# Patient Record
Sex: Female | Born: 1950
Health system: Southern US, Community
[De-identification: ages and names within clinical notes are randomized; demographics above are authoritative.]

## PROBLEM LIST (undated history)

## (undated) DIAGNOSIS — I341 Nonrheumatic mitral (valve) prolapse: Secondary | ICD-10-CM

## (undated) DIAGNOSIS — F329 Major depressive disorder, single episode, unspecified: Secondary | ICD-10-CM

## (undated) DIAGNOSIS — C801 Malignant (primary) neoplasm, unspecified: Secondary | ICD-10-CM

## (undated) DIAGNOSIS — B029 Zoster without complications: Secondary | ICD-10-CM

## (undated) DIAGNOSIS — J45909 Unspecified asthma, uncomplicated: Secondary | ICD-10-CM

## (undated) DIAGNOSIS — K5790 Diverticulosis of intestine, part unspecified, without perforation or abscess without bleeding: Secondary | ICD-10-CM

## (undated) DIAGNOSIS — F32A Depression, unspecified: Secondary | ICD-10-CM

## (undated) DIAGNOSIS — I10 Essential (primary) hypertension: Secondary | ICD-10-CM

## (undated) DIAGNOSIS — M1990 Unspecified osteoarthritis, unspecified site: Secondary | ICD-10-CM

## (undated) HISTORY — PX: APPENDECTOMY: SHX54

## (undated) HISTORY — DX: Major depressive disorder, single episode, unspecified: F32.9

## (undated) HISTORY — DX: Essential (primary) hypertension: I10

## (undated) HISTORY — DX: Unspecified osteoarthritis, unspecified site: M19.90

## (undated) HISTORY — DX: Unspecified asthma, uncomplicated: J45.909

## (undated) HISTORY — DX: Zoster without complications: B02.9

## (undated) HISTORY — PX: BREAST CYST ASPIRATION: SHX578

## (undated) HISTORY — DX: Nonrheumatic mitral (valve) prolapse: I34.1

## (undated) HISTORY — DX: Depression, unspecified: F32.A

## (undated) HISTORY — DX: Diverticulosis of intestine, part unspecified, without perforation or abscess without bleeding: K57.90

## (undated) HISTORY — PX: CATARACT EXTRACTION: SUR2

---

## 2002-01-30 ENCOUNTER — Encounter: Admission: RE | Admit: 2002-01-30 | Discharge: 2002-03-25 | Payer: Self-pay

## 2004-04-15 ENCOUNTER — Ambulatory Visit: Payer: Self-pay | Admitting: Gastroenterology

## 2004-06-26 HISTORY — PX: FOOT NEUROMA SURGERY: SHX646

## 2004-07-05 ENCOUNTER — Ambulatory Visit: Payer: Self-pay

## 2004-09-09 ENCOUNTER — Ambulatory Visit: Payer: Self-pay

## 2005-09-15 ENCOUNTER — Ambulatory Visit: Payer: Self-pay

## 2006-04-25 ENCOUNTER — Ambulatory Visit: Payer: Self-pay | Admitting: Family Medicine

## 2006-05-15 ENCOUNTER — Other Ambulatory Visit: Payer: Self-pay

## 2006-05-23 ENCOUNTER — Ambulatory Visit: Payer: Self-pay | Admitting: Podiatry

## 2006-05-29 ENCOUNTER — Ambulatory Visit: Payer: Self-pay | Admitting: Podiatry

## 2006-09-19 ENCOUNTER — Ambulatory Visit: Payer: Self-pay

## 2007-09-24 ENCOUNTER — Ambulatory Visit: Payer: Self-pay

## 2008-09-28 ENCOUNTER — Ambulatory Visit: Payer: Self-pay

## 2009-06-26 HISTORY — PX: BACK SURGERY: SHX140

## 2009-06-29 ENCOUNTER — Ambulatory Visit: Payer: Self-pay | Admitting: Family Medicine

## 2009-07-01 ENCOUNTER — Emergency Department: Payer: Self-pay | Admitting: Internal Medicine

## 2009-11-04 ENCOUNTER — Ambulatory Visit: Payer: Self-pay

## 2009-12-23 ENCOUNTER — Ambulatory Visit: Payer: Self-pay | Admitting: Family Medicine

## 2010-01-04 ENCOUNTER — Ambulatory Visit: Payer: Self-pay | Admitting: Family Medicine

## 2010-11-24 ENCOUNTER — Ambulatory Visit: Payer: Self-pay

## 2012-04-15 ENCOUNTER — Ambulatory Visit: Payer: Self-pay | Admitting: Family Medicine

## 2013-04-24 ENCOUNTER — Ambulatory Visit: Payer: Self-pay | Admitting: Family Medicine

## 2013-04-24 LAB — CBC WITH DIFFERENTIAL/PLATELET
Basophil %: 0.5 %
Eosinophil #: 0.2 10*3/uL (ref 0.0–0.7)
Eosinophil %: 2.1 %
Lymphocyte #: 1.6 10*3/uL (ref 1.0–3.6)
Neutrophil #: 5.9 10*3/uL (ref 1.4–6.5)
RDW: 12.8 % (ref 11.5–14.5)
WBC: 8.5 10*3/uL (ref 3.6–11.0)

## 2013-08-07 DIAGNOSIS — K5792 Diverticulitis of intestine, part unspecified, without perforation or abscess without bleeding: Secondary | ICD-10-CM | POA: Insufficient documentation

## 2013-08-27 HISTORY — PX: COLONOSCOPY: SHX5424

## 2013-12-24 DIAGNOSIS — C4491 Basal cell carcinoma of skin, unspecified: Secondary | ICD-10-CM | POA: Insufficient documentation

## 2013-12-24 DIAGNOSIS — Z85828 Personal history of other malignant neoplasm of skin: Secondary | ICD-10-CM | POA: Insufficient documentation

## 2013-12-24 HISTORY — PX: BASAL CELL CARCINOMA EXCISION: SHX1214

## 2014-03-04 ENCOUNTER — Ambulatory Visit: Payer: Self-pay | Admitting: Family Medicine

## 2014-05-19 DIAGNOSIS — I459 Conduction disorder, unspecified: Secondary | ICD-10-CM | POA: Insufficient documentation

## 2014-12-17 ENCOUNTER — Other Ambulatory Visit: Payer: Self-pay | Admitting: Family Medicine

## 2014-12-17 DIAGNOSIS — I10 Essential (primary) hypertension: Secondary | ICD-10-CM

## 2015-03-12 ENCOUNTER — Encounter: Payer: Self-pay | Admitting: Family Medicine

## 2015-03-12 ENCOUNTER — Ambulatory Visit (INDEPENDENT_AMBULATORY_CARE_PROVIDER_SITE_OTHER): Payer: BLUE CROSS/BLUE SHIELD | Admitting: Family Medicine

## 2015-03-12 VITALS — BP 122/80 | HR 80 | Ht 61.0 in | Wt 146.0 lb

## 2015-03-12 DIAGNOSIS — M519 Unspecified thoracic, thoracolumbar and lumbosacral intervertebral disc disorder: Secondary | ICD-10-CM | POA: Insufficient documentation

## 2015-03-12 DIAGNOSIS — M1990 Unspecified osteoarthritis, unspecified site: Secondary | ICD-10-CM | POA: Insufficient documentation

## 2015-03-12 DIAGNOSIS — I10 Essential (primary) hypertension: Secondary | ICD-10-CM | POA: Diagnosis not present

## 2015-03-12 DIAGNOSIS — G4700 Insomnia, unspecified: Secondary | ICD-10-CM | POA: Diagnosis not present

## 2015-03-12 DIAGNOSIS — K219 Gastro-esophageal reflux disease without esophagitis: Secondary | ICD-10-CM | POA: Insufficient documentation

## 2015-03-12 MED ORDER — ZOLPIDEM TARTRATE 10 MG PO TABS: 10.0000 mg | ORAL_TABLET | Freq: Every evening | ORAL | Status: DC | PRN

## 2015-03-12 MED ORDER — METOPROLOL SUCCINATE ER 50 MG PO TB24: 25.0000 mg | ORAL_TABLET | Freq: Every day | ORAL | Status: DC

## 2015-03-12 NOTE — Progress Notes (Signed)
Name: Leslie Silva   MRN: [id number]    DOB: [birth year]   Date:03/12/2015       Progress Note  Subjective  Chief Complaint  Chief Complaint  Patient presents with  . Hypertension    Hypertension This is a chronic problem. The current episode started more than 1 year ago. The problem has been gradually improving since onset. The problem is controlled. Pertinent negatives include no anxiety, blurred vision, chest pain, headaches, malaise/fatigue, neck pain, orthopnea, palpitations, peripheral edema, PND, shortness of breath or sweats. There are no associated agents to hypertension. There are no known risk factors for coronary artery disease. Past treatments include beta blockers. The current treatment provides mild improvement. There is no history of angina, kidney disease, CAD/MI, CVA, heart failure, left ventricular hypertrophy, PVD, renovascular disease or retinopathy. There is no history of chronic renal disease.    No problem-specific assessment & plan notes found for this encounter.   Past Medical History  Diagnosis Date  . Hypertension     Past Surgical History  Procedure Laterality Date  . Cataract extraction Bilateral   . Back surgery  2011  . Foot neuroma surgery Left 2006  . Colonoscopy  2015    cleared for 10 yrs- divert- Dr Eliberto Ivory [facility]    Family History  Problem Relation Age of Onset  . Cancer Mother     Social History   Social History  . Marital Status: Married    Spouse Name: N/A  . Number of Children: N/A  . Years of Education: N/A   Occupational History  . Not on file.   Social History Main Topics  . Smoking status: Never Smoker   . Smokeless tobacco: Not on file  . Alcohol Use: No  . Drug Use: No  . Sexual Activity: Yes   Other Topics Concern  . Not on file   Social History Narrative  . No narrative on file    Allergies  Allergen Reactions  . Sulfa Antibiotics Swelling  . Vioxx [Rofecoxib]   . Penicillins Rash  . Tape Rash      Review of Systems  Constitutional: Negative for fever, chills, weight loss and malaise/fatigue.  HENT: Negative for ear discharge, ear pain and sore throat.   Eyes: Negative for blurred vision.  Respiratory: Negative for cough, sputum production, shortness of breath and wheezing.   Cardiovascular: Negative for chest pain, palpitations, orthopnea, leg swelling and PND.  Gastrointestinal: Negative for heartburn, nausea, abdominal pain, diarrhea, constipation, blood in stool and melena.  Genitourinary: Negative for dysuria, urgency, frequency and hematuria.  Musculoskeletal: Negative for myalgias, back pain, joint pain and neck pain.  Skin: Negative for rash.  Neurological: Negative for dizziness, tingling, sensory change, focal weakness and headaches.  Endo/Heme/Allergies: Negative for environmental allergies and polydipsia. Does not bruise/bleed easily.  Psychiatric/Behavioral: Negative for depression and suicidal ideas. The patient is not nervous/anxious and does not have insomnia.      Objective  Filed Vitals:   03/12/15 1126  BP: 122/80  Pulse: 80  Height: 5\' 1"  (1.549 m)  Weight: 146 lb (66.225 kg)    Physical Exam  Constitutional: She is well-developed, well-nourished, and in no distress. No distress.  HENT:  Head: Normocephalic and atraumatic.  Right Ear: External ear normal.  Left Ear: External ear normal.  Nose: Nose normal.  Mouth/Throat: Oropharynx is clear and moist.  Eyes: Conjunctivae and EOM are normal. Pupils are equal, round, and reactive to light. Right eye exhibits no discharge. Left eye  exhibits no discharge.  Neck: Normal range of motion. Neck supple. No JVD present. No thyromegaly present.  Cardiovascular: Normal rate, regular rhythm, normal heart sounds and intact distal pulses.  Exam reveals no gallop and no friction rub.   No murmur heard. Pulmonary/Chest: Effort normal and breath sounds normal.  Abdominal: Soft. Bowel sounds are normal. She  exhibits no mass. There is no tenderness. There is no guarding.  Musculoskeletal: Normal range of motion. She exhibits edema.  Lymphadenopathy:    She has no cervical adenopathy.  Neurological: She is alert. She has normal reflexes.  Skin: Skin is warm and dry. She is not diaphoretic.  Psychiatric: Mood and affect normal.      Assessment & Plan  Problem List Items Addressed This Visit      Cardiovascular and Mediastinum   BP (high blood pressure) - Primary   Relevant Medications   metoprolol succinate (TOPROL-XL) 50 MG 24 hr tablet   Other Relevant Orders   Renal Function Panel    Other Visit Diagnoses    Insomnia        Relevant Medications    zolpidem (AMBIEN) 10 MG tablet         Dr. Deanna Jones [facility]  03/12/2015

## 2015-03-13 LAB — RENAL FUNCTION PANEL
ALBUMIN: 4.8 g/dL (ref 3.6–4.8)
BUN/Creatinine Ratio: 21 (ref 11–26)
BUN: 12 mg/dL (ref 8–27)
CALCIUM: 9.5 mg/dL (ref 8.7–10.3)
CHLORIDE: 102 mmol/L (ref 97–108)
CO2: 24 mmol/L (ref 18–29)
Creatinine, Ser: 0.56 mg/dL — ABNORMAL LOW (ref 0.57–1.00)
GFR calc Af Amer: 115 mL/min/{1.73_m2} (ref >59)
GFR calc non Af Amer: 100 mL/min/{1.73_m2} (ref >59)
Glucose: 98 mg/dL (ref 65–99)
Phosphorus: 3.4 mg/dL (ref 2.5–4.5)
Potassium: 4.3 mmol/L (ref 3.5–5.2)
Sodium: 142 mmol/L (ref 134–144)

## 2015-03-31 ENCOUNTER — Ambulatory Visit (INDEPENDENT_AMBULATORY_CARE_PROVIDER_SITE_OTHER): Payer: BLUE CROSS/BLUE SHIELD | Admitting: Family Medicine

## 2015-03-31 ENCOUNTER — Encounter: Payer: Self-pay | Admitting: Family Medicine

## 2015-03-31 VITALS — BP 110/80 | HR 76 | Ht 61.0 in | Wt 135.0 lb

## 2015-03-31 DIAGNOSIS — J4521 Mild intermittent asthma with (acute) exacerbation: Secondary | ICD-10-CM

## 2015-03-31 DIAGNOSIS — J40 Bronchitis, not specified as acute or chronic: Secondary | ICD-10-CM

## 2015-03-31 MED ORDER — ALBUTEROL SULFATE (2.5 MG/3ML) 0.083% IN NEBU: 2.5000 mg | INHALATION_SOLUTION | Freq: Once | RESPIRATORY_TRACT | Status: AC

## 2015-03-31 MED ORDER — PREDNISONE 10 MG PO TABS: 10.0000 mg | ORAL_TABLET | Freq: Every day | ORAL | Status: DC

## 2015-03-31 MED ORDER — GUAIFENESIN-CODEINE 100-10 MG/5ML PO SOLN: 5.0000 mL | Freq: Three times a day (TID) | ORAL | Status: DC | PRN

## 2015-03-31 MED ORDER — AZITHROMYCIN 250 MG PO TABS: ORAL_TABLET | ORAL | Status: DC

## 2015-03-31 NOTE — Progress Notes (Signed)
Name: Leslie Silva   MRN: [id number]    DOB: Nov 20, 1950   Date:03/31/2015       Progress Note  Subjective  Chief Complaint  Chief Complaint  Patient presents with  . Cough    at night when laying down- thick/ clear production    Cough This is a recurrent problem. The current episode started 1 to 4 weeks ago. The problem has been waxing and waning. The cough is non-productive. Associated symptoms include nasal congestion, rhinorrhea and shortness of breath. Pertinent negatives include no chest pain, chills, ear congestion, ear pain, fever, headaches, heartburn, hemoptysis, myalgias, postnasal drip, rash, sore throat, sweats, weight loss or wheezing. The symptoms are aggravated by pollens. Risk factors for lung disease include travel (cam[place]). She has tried a beta-agonist inhaler and ipratropium inhaler for the symptoms. The treatment provided no relief. Her past medical history is significant for asthma, bronchitis and environmental allergies.    No problem-specific assessment & plan notes found for this encounter.   Past Medical History  Diagnosis Date  . Hypertension     Past Surgical History  Procedure Laterality Date  . Cataract extraction Bilateral   . Back surgery  2011  . Foot neuroma surgery Left 2006  . Colonoscopy  2015    cleared for 10 yrs- divert- Dr Eliberto [facility]ory [facility]    Family History  Problem Relation Age of Onset  . Cancer Mother     Social History   Social History  . Marital Status: Married    Spouse Name: N/A  . Number of Children: N/A  . Years of Education: N/A   Occupational History  . Not on file.   Social History Main Topics  . Smoking status: Never Smoker   . Smokeless tobacco: Not on file  . Alcohol Use: No  . Drug Use: No  . Sexual Activity: Yes   Other Topics Concern  . Not on file   Social History Narrative    Allergies  Allergen Reactions  . Sulfa Antibiotics Swelling  . Vioxx [Rofecoxib]   . Penicillins Rash  . Tape Rash      Review of Systems  Constitutional: Negative for fever, chills, weight loss and malaise/fatigue.  HENT: Positive for rhinorrhea. Negative for ear discharge, ear pain, postnasal drip and sore throat.   Eyes: Negative for blurred vision.  Respiratory: Positive for cough and shortness of breath. Negative for hemoptysis, sputum production and wheezing.   Cardiovascular: Negative for chest pain, palpitations and leg swelling.  Gastrointestinal: Negative for heartburn, nausea, abdominal pain, diarrhea, constipation, blood in stool and melena.  Genitourinary: Negative for dysuria, urgency, frequency and hematuria.  Musculoskeletal: Negative for myalgias, back pain, joint pain and neck pain.  Skin: Negative for rash.  Neurological: Negative for dizziness, tingling, sensory change, focal weakness and headaches.  Endo/Heme/Allergies: Positive for environmental allergies. Negative for polydipsia. Does not bruise/bleed easily.  Psychiatric/Behavioral: Negative for depression and suicidal ideas. The patient is not nervous/anxious and does not have insomnia.      Objective  Filed Vitals:   03/31/15 1053  BP: 110/80  Pulse: 76  Height: 5\' 1"  (1.549 m)  Weight: 135 lb (61.236 kg)    Physical Exam  Constitutional: She is well-developed, well-nourished, and in no distress. No distress.  HENT:  Head: Normocephalic and atraumatic.  Right Ear: External ear normal.  Left Ear: External ear normal.  Nose: Nose normal.  Mouth/Throat: Oropharynx is clear and moist.  Eyes: Conjunctivae and EOM are normal. Pupils are equal,  round, and reactive to light. Right eye exhibits no discharge. Left eye exhibits no discharge.  Neck: Normal range of motion. Neck supple. No JVD present. No thyromegaly present.  Cardiovascular: Normal rate, regular rhythm, normal heart sounds and intact distal pulses.  Exam reveals no gallop and no friction rub.   No murmur heard. Pulmonary/Chest: Effort normal and breath  sounds normal.  Abdominal: Soft. Bowel sounds are normal. She exhibits no mass. There is no tenderness. There is no guarding.  Musculoskeletal: Normal range of motion. She exhibits no edema.  Lymphadenopathy:    She has no cervical adenopathy.  Neurological: She is alert. She has normal reflexes.  Skin: Skin is warm and dry. She is not diaphoretic.  Psychiatric: Mood and affect normal.  Nursing note and vitals reviewed.     Assessment & Plan  Problem List Items Addressed This Visit    None        Dr. Otilio Miu [facility]  03/31/2015

## 2015-10-25 ENCOUNTER — Ambulatory Visit (INDEPENDENT_AMBULATORY_CARE_PROVIDER_SITE_OTHER): Payer: BLUE CROSS/BLUE SHIELD | Admitting: Family Medicine

## 2015-10-25 ENCOUNTER — Ambulatory Visit
Admission: RE | Admit: 2015-10-25 | Discharge: 2015-10-25 | Disposition: A | Discharge status: Home / Self Care | Payer: BLUE CROSS/BLUE SHIELD | Source: Ambulatory Visit | Attending: Family Medicine | Admitting: Family Medicine

## 2015-10-25 ENCOUNTER — Encounter: Payer: Self-pay | Admitting: Family Medicine

## 2015-10-25 VITALS — BP 120/70 | HR 70 | Ht 61.0 in | Wt 141.0 lb

## 2015-10-25 DIAGNOSIS — M4647 Discitis, unspecified, lumbosacral region: Secondary | ICD-10-CM | POA: Insufficient documentation

## 2015-10-25 DIAGNOSIS — M519 Unspecified thoracic, thoracolumbar and lumbosacral intervertebral disc disorder: Secondary | ICD-10-CM

## 2015-10-25 DIAGNOSIS — M5136 Other intervertebral disc degeneration, lumbar region: Secondary | ICD-10-CM | POA: Insufficient documentation

## 2015-10-25 MED ORDER — CYCLOBENZAPRINE HCL 10 MG PO TABS: 10.0000 mg | ORAL_TABLET | Freq: Three times a day (TID) | ORAL | Status: DC | PRN

## 2015-10-25 MED ORDER — PREDNISONE 10 MG PO TABS
10.0000 mg | ORAL_TABLET | Freq: Every day | ORAL | Status: DC
Start: 2015-10-25 — End: 2016-04-07

## 2015-10-25 MED ORDER — MELOXICAM 15 MG PO TABS: 15.0000 mg | ORAL_TABLET | Freq: Every day | ORAL | Status: DC

## 2015-10-25 NOTE — Progress Notes (Signed)
Name: Leslie Silva   MRN: [id number]    DOB: [birth year]   Date:10/25/2015       Progress Note  Subjective  Chief Complaint  Chief Complaint  Patient presents with  . Back Pain    Back Pain This is a recurrent problem. The current episode started more than 1 year ago (acute worening over the past week). The problem occurs daily. The problem has been waxing and waning since onset. The pain is present in the lumbar spine. The quality of the pain is described as aching. The pain radiates to the right thigh and right knee. The pain is at a severity of 8/10. The pain is moderate. The pain is the same all the time. The symptoms are aggravated by bending, twisting and standing (dancing). Associated symptoms include tingling. Pertinent negatives include no abdominal pain, bladder incontinence, bowel incontinence, chest pain, dysuria, fever, headaches, numbness, paresis, paresthesias, perianal numbness or weight loss. She has tried muscle relaxant and NSAIDs (surgery in past) for the symptoms. The treatment provided no relief.    No problem-specific assessment & plan notes found for this encounter.   Past Medical History  Diagnosis Date  . Hypertension     Past Surgical History  Procedure Laterality Date  . Cataract extraction Bilateral   . Back surgery  2011  . Foot neuroma surgery Left 2006  . Colonoscopy  2015    cleared for 10 yrs- divert- Dr Eliberto Ivory [facility]    Family History  Problem Relation Age of Onset  . Cancer Mother     Social History   Social History  . Marital Status: Married    Spouse Name: N/A  . Number of Children: N/A  . Years of Education: N/A   Occupational History  . Not on file.   Social History Main Topics  . Smoking status: Never Smoker   . Smokeless tobacco: Not on file  . Alcohol Use: No  . Drug Use: No  . Sexual Activity: Yes   Other Topics Concern  . Not on file   Social History Narrative    Allergies  Allergen Reactions  . Sulfa Antibiotics  Swelling  . Vioxx [Rofecoxib]   . Penicillins Rash  . Tape Rash     Review of Systems  Constitutional: Negative for fever, chills, weight loss and malaise/fatigue.  HENT: Negative for ear discharge, ear pain and sore throat.   Eyes: Negative for blurred vision.  Respiratory: Negative for cough, sputum production, shortness of breath and wheezing.   Cardiovascular: Negative for chest pain, palpitations and leg swelling.  Gastrointestinal: Negative for heartburn, nausea, abdominal pain, diarrhea, constipation, blood in stool, melena and bowel incontinence.  Genitourinary: Negative for bladder incontinence, dysuria, urgency, frequency and hematuria.  Musculoskeletal: Positive for back pain. Negative for myalgias, joint pain and neck pain.  Skin: Negative for rash.  Neurological: Positive for tingling. Negative for dizziness, sensory change, focal weakness, numbness, headaches and paresthesias.  Endo/Heme/Allergies: Negative for environmental allergies and polydipsia. Does not bruise/bleed easily.  Psychiatric/Behavioral: Negative for depression and suicidal ideas. The patient is not nervous/anxious and does not have insomnia.      Objective  Filed Vitals:   10/25/15 1008  BP: 120/70  Pulse: 70  Height: 5\' 1"  (1.549 m)  Weight: 141 lb (63.957 kg)    Physical Exam  Constitutional: She is well-developed, well-nourished, and in no distress. No distress.  HENT:  Head: Normocephalic and atraumatic.  Right Ear: External ear normal.  Left Ear: External ear  normal.  Nose: Nose normal.  Mouth/Throat: Oropharynx is clear and moist.  Eyes: Conjunctivae and EOM are normal. Pupils are equal, round, and reactive to light. Right eye exhibits no discharge. Left eye exhibits no discharge.  Neck: Normal range of motion. Neck supple. No JVD present. No thyromegaly present.  Cardiovascular: Normal rate, regular rhythm, normal heart sounds and intact distal pulses.  Exam reveals no gallop and no  friction rub.   No murmur heard. Pulmonary/Chest: Effort normal and breath sounds normal.  Abdominal: Soft. Bowel sounds are normal. She exhibits no mass. There is no tenderness. There is no guarding.  Musculoskeletal: Normal range of motion. She exhibits no edema.  Lymphadenopathy:    She has no cervical adenopathy.  Neurological: She is alert. She has normal sensation, normal strength and normal reflexes.  Skin: Skin is warm and dry. She is not diaphoretic.  Psychiatric: Mood and affect normal.  Nursing note and vitals reviewed.     Assessment & Plan  Problem List Items Addressed This Visit    None    Visit Diagnoses    Lumbosacral disc disease    -  Primary    Relevant Medications    meloxicam (MOBIC) 15 MG tablet    predniSONE (DELTASONE) 10 MG tablet    cyclobenzaprine (FLEXERIL) 10 MG tablet    Other Relevant Orders    DG Lumbar Spine Complete         Dr. Deanna Jones [facility]  10/25/2015

## 2016-03-28 ENCOUNTER — Other Ambulatory Visit: Payer: Self-pay | Admitting: Family Medicine

## 2016-03-28 DIAGNOSIS — I10 Essential (primary) hypertension: Secondary | ICD-10-CM

## 2016-04-07 ENCOUNTER — Ambulatory Visit (INDEPENDENT_AMBULATORY_CARE_PROVIDER_SITE_OTHER): Payer: BLUE CROSS/BLUE SHIELD | Admitting: Family Medicine

## 2016-04-07 ENCOUNTER — Encounter: Payer: Self-pay | Admitting: Family Medicine

## 2016-04-07 VITALS — BP 120/70 | HR 78 | Ht 61.0 in | Wt 136.0 lb

## 2016-04-07 DIAGNOSIS — I10 Essential (primary) hypertension: Secondary | ICD-10-CM

## 2016-04-07 DIAGNOSIS — J4520 Mild intermittent asthma, uncomplicated: Secondary | ICD-10-CM

## 2016-04-07 DIAGNOSIS — M4696 Unspecified inflammatory spondylopathy, lumbar region: Secondary | ICD-10-CM

## 2016-04-07 DIAGNOSIS — Z79890 Hormone replacement therapy (postmenopausal): Secondary | ICD-10-CM

## 2016-04-07 DIAGNOSIS — R000 Tachycardia, unspecified: Secondary | ICD-10-CM

## 2016-04-07 DIAGNOSIS — Z23 Encounter for immunization: Secondary | ICD-10-CM | POA: Diagnosis not present

## 2016-04-07 DIAGNOSIS — M47816 Spondylosis without myelopathy or radiculopathy, lumbar region: Secondary | ICD-10-CM

## 2016-04-07 MED ORDER — ESTROGENS, CONJUGATED 0.625 MG/GM VA CREA: 1.0000 | TOPICAL_CREAM | VAGINAL | 11 refills | Status: DC

## 2016-04-07 MED ORDER — IPRATROPIUM-ALBUTEROL 18-103 MCG/ACT IN AERO: 1.0000 | INHALATION_SPRAY | RESPIRATORY_TRACT | 11 refills | Status: DC | PRN

## 2016-04-07 MED ORDER — METOPROLOL SUCCINATE ER 50 MG PO TB24: ORAL_TABLET | ORAL | 3 refills | Status: DC

## 2016-04-07 NOTE — Progress Notes (Signed)
Name: Leslie Silva   MRN: [id number]    DOB: [birth year]   Date:04/07/2016       Progress Note  Subjective  Chief Complaint  Chief Complaint  Patient presents with  . Asthma    ran out of inhaler- needs refill     Patient presents for fefill of hormone replacement therapy.   Asthma  There is no chest tightness, cough, difficulty breathing, frequent throat clearing, hemoptysis, hoarse voice, shortness of breath, sputum production or wheezing. This is a recurrent problem. The current episode started 1 to 4 weeks ago (acute phase ). The problem occurs intermittently. The problem has been waxing and waning. Pertinent negatives include no appetite change, chest pain, dyspnea on exertion, ear congestion, ear pain, fever, headaches, heartburn, malaise/fatigue, myalgias, nasal congestion, orthopnea, PND, postnasal drip, rhinorrhea, sneezing, sore throat, sweats, trouble swallowing or weight loss. Her symptoms are aggravated by change in weather. Her symptoms are alleviated by beta-agonist. She reports moderate improvement on treatment. Her symptoms are not alleviated by beta-agonist. Her past medical history is significant for asthma and bronchitis. There is no history of bronchiectasis, COPD, emphysema or pneumonia.  Arthritis  Presents for follow-up visit. The symptoms have been stable. Pertinent negatives include no diarrhea, dysuria, fever, rash or weight loss.  Heart Problem  This is a chronic problem. The current episode started more than 1 year ago (tachyrhythmia). The problem occurs intermittently. The problem has been waxing and waning. Pertinent negatives include no abdominal pain, chest pain, chills, coughing, fever, headaches, myalgias, nausea, neck pain, rash or sore throat. Nothing (anxiety) aggravates the symptoms. The treatment provided moderate relief.  Hypertension  This is a chronic problem. The current episode started more than 1 year ago. The problem has been gradually improving  since onset. Pertinent negatives include no blurred vision, chest pain, headaches, malaise/fatigue, neck pain, palpitations, PND, shortness of breath or sweats. Past treatments include beta blockers.    No problem-specific Assessment & Plan notes found for this encounter.   Past Medical History:  Diagnosis Date  . Arthritis   . Hypertension     Past Surgical History:  Procedure Laterality Date  . BACK SURGERY  2011  . CATARACT EXTRACTION Bilateral   . COLONOSCOPY  2015   cleared for 10 yrs- divert- Dr Eliberto Ivory [facility]  . FOOT NEUROMA SURGERY Left 2006    Family History  Problem Relation Age of Onset  . Cancer Mother     Social History   Social History  . Marital status: Married    Spouse name: N/A  . Number of children: N/A  . Years of education: N/A   Occupational History  . Not on file.   Social History Main Topics  . Smoking status: Never Smoker  . Smokeless tobacco: Not on file  . Alcohol use No  . Drug use: No  . Sexual activity: Yes   Other Topics Concern  . Not on file   Social History Narrative  . No narrative on file    Allergies  Allergen Reactions  . Sulfa Antibiotics Swelling  . Vioxx [Rofecoxib]   . Penicillins Rash  . Tape Rash     Review of Systems  Constitutional: Negative for appetite change, chills, fever, malaise/fatigue and weight loss.  HENT: Negative for ear discharge, ear pain, hoarse voice, postnasal drip, rhinorrhea, sneezing, sore throat and trouble swallowing.   Eyes: Negative for blurred vision.  Respiratory: Negative for cough, hemoptysis, sputum production, shortness of breath and wheezing.   Cardiovascular:  Negative for chest pain, dyspnea on exertion, palpitations, leg swelling and PND.  Gastrointestinal: Negative for abdominal pain, blood in stool, constipation, diarrhea, heartburn, melena and nausea.  Genitourinary: Negative for dysuria, frequency, hematuria and urgency.  Musculoskeletal: Positive for arthritis. Negative  for back pain, joint pain, myalgias and neck pain.  Skin: Negative for rash.  Neurological: Negative for dizziness, tingling, sensory change, focal weakness and headaches.  Endo/Heme/Allergies: Negative for environmental allergies and polydipsia. Does not bruise/bleed easily.  Psychiatric/Behavioral: Negative for depression and suicidal ideas. The patient is not nervous/anxious and does not have insomnia.      Objective  Vitals:   04/07/16 0939  BP: 120/70  Pulse: 78  Weight: 136 lb (61.7 kg)  Height: 5\' 1"  (1.549 m)    Physical Exam  Constitutional: She is well-developed, well-nourished, and in no distress. No distress.  HENT:  Head: Normocephalic and atraumatic.  Right Ear: External ear normal.  Left Ear: External ear normal.  Nose: Nose normal.  Mouth/Throat: Oropharynx is clear and moist.  Eyes: Conjunctivae and EOM are normal. Pupils are equal, round, and reactive to light. Right eye exhibits no discharge. Left eye exhibits no discharge.  Neck: Normal range of motion. Neck supple. No JVD present. No thyromegaly present.  Cardiovascular: Normal rate, regular rhythm, normal heart sounds and intact distal pulses.  Exam reveals no gallop and no friction rub.   No murmur heard. Pulmonary/Chest: Effort normal and breath sounds normal. She has no wheezes. She has no rales.  Abdominal: Soft. Bowel sounds are normal. She exhibits no mass. There is no tenderness. There is no guarding.  Musculoskeletal: Normal range of motion. She exhibits no edema.  Lymphadenopathy:    She has no cervical adenopathy.  Neurological: She is alert. She has normal reflexes.  Skin: Skin is warm and dry. She is not diaphoretic.  Psychiatric: Mood and affect normal.  Nursing note and vitals reviewed.     Assessment & Plan  Problem List Items Addressed This Visit      Cardiovascular and Mediastinum   BP (high blood pressure)   Relevant Medications   metoprolol succinate (TOPROL-XL) 50 MG 24 hr  tablet    Other Visit Diagnoses    Mild intermittent reactive airway disease without complication    -  Primary   Relevant Medications   albuterol-ipratropium (COMBIVENT) 18-103 MCG/ACT inhaler   Lumbar arthropathy (HCC)       Tachycardia       Hormone replacement therapy (HRT)       Relevant Medications   conjugated estrogens (PREMARIN) vaginal cream (Start on 04/10/2016)   Flu vaccine need       Relevant Orders   Flu Vaccine QUAD 36+ mos PF IM (Fluarix & Fluzone Quad PF) (Completed)        Dr. Autum Benfer [facility]  04/07/16

## 2016-05-26 ENCOUNTER — Other Ambulatory Visit: Payer: Self-pay | Admitting: Family Medicine

## 2016-05-26 DIAGNOSIS — I10 Essential (primary) hypertension: Secondary | ICD-10-CM

## 2016-08-18 ENCOUNTER — Ambulatory Visit (INDEPENDENT_AMBULATORY_CARE_PROVIDER_SITE_OTHER): Payer: BLUE CROSS/BLUE SHIELD | Admitting: Family Medicine

## 2016-08-18 VITALS — BP 138/80 | HR 78 | Ht 61.0 in | Wt 135.0 lb

## 2016-08-18 DIAGNOSIS — R002 Palpitations: Secondary | ICD-10-CM

## 2016-08-18 DIAGNOSIS — I10 Essential (primary) hypertension: Secondary | ICD-10-CM | POA: Diagnosis not present

## 2016-08-18 NOTE — Progress Notes (Signed)
Name: Leslie Silva   MRN: [id number]    DOB: 03-25-1951   Date:08/18/2016       Progress Note  Subjective  Chief Complaint  Chief Complaint  Patient presents with  . Palpitations    "feels like a fish flopping around in my chest"- had an EKG and Doppler with cardio and is supposed to follow up with Dr Mina Marble in March.    Palpitations   This is a recurrent problem. The current episode started in the past 7 days ("feels like fish flopping in chest"). The problem occurs 2 to 4 times per day (duration 2-60 minutes). The problem has been waxing and waning. On average, each episode lasts 60 minutes. Nothing aggravates the symptoms. Associated symptoms include dizziness and shortness of breath. Pertinent negatives include no anxiety, chest fullness, chest pain, coughing, diaphoresis, fever, irregular heartbeat, malaise/fatigue, nausea, near-syncope, numbness, syncope, vomiting or weakness. Associated symptoms comments: Has to take deep breath/ ? discomfort. She has tried beta blockers for the symptoms. The treatment provided no relief. There is no history of anemia, anxiety, drug use, heart disease, hyperthyroidism or a valve disorder.    No problem-specific Assessment & Plan notes found for this encounter.   Past Medical History:  Diagnosis Date  . Arthritis   . Hypertension     Past Surgical History:  Procedure Laterality Date  . BACK SURGERY  2011  . CATARACT EXTRACTION Bilateral   . COLONOSCOPY  2015   cleared for 10 yrs- divert- Dr Eliberto Ivory [facility]  . FOOT NEUROMA SURGERY Left 2006    Family History  Problem Relation Age of Onset  . Cancer Mother     Social History   Social History  . Marital status: Married    Spouse name: N/A  . Number of children: N/A  . Years of education: N/A   Occupational History  . Not on file.   Social History Main Topics  . Smoking status: Never Smoker  . Smokeless tobacco: Not on file  . Alcohol use No  . Drug use: No  . Sexual activity: Yes    Other Topics Concern  . Not on file   Social History Narrative  . No narrative on file    Allergies  Allergen Reactions  . Sulfa Antibiotics Swelling  . Vioxx [Rofecoxib]   . Penicillins Rash  . Tape Rash    Outpatient Medications Prior to Visit  Medication Sig Dispense Refill  . albuterol-ipratropium (COMBIVENT) 18-103 MCG/ACT inhaler Inhale 1 puff into the lungs as needed. 1 Inhaler 11  . conjugated estrogens (PREMARIN) vaginal cream Place 1 Applicatorful vaginally 2 (two) times a week. 42.5 g 11  . ibuprofen (ADVIL,MOTRIN) 200 MG tablet Take 1 tablet by mouth as needed.    . metoprolol succinate (TOPROL-XL) 50 MG 24 hr tablet TAKE 1 TABLET BY MOUTH  DAILY -TAKE WITH OR  IMMEDIATELY FOLLOWING A  MEAL 90 tablet 0  . diphenhydrAMINE (BENADRYL) 25 mg capsule Take 1 capsule by mouth at bedtime.    . meloxicam (MOBIC) 15 MG tablet Take 1 tablet (15 mg total) by mouth daily. (Patient not taking: Reported on 04/07/2016) 30 tablet 1  . zolpidem (AMBIEN) 10 MG tablet Take 1 tablet (10 mg total) by mouth at bedtime as needed for sleep. (Patient not taking: Reported on 04/07/2016) 15 tablet 1   Facility-Administered Medications Prior to Visit  Medication Dose Route Frequency Provider Last Rate Last Dose  . albuterol (PROVENTIL) (2.5 MG/3ML) 0.083% nebulizer solution 2.5 mg  2.5 mg Nebulization Once Juline Patch, MD        Review of Systems  Constitutional: Negative for chills, diaphoresis, fever, malaise/fatigue and weight loss.  HENT: Negative for ear discharge, ear pain and sore throat.   Eyes: Negative for blurred vision.  Respiratory: Positive for shortness of breath. Negative for cough, sputum production and wheezing.   Cardiovascular: Positive for palpitations. Negative for chest pain, leg swelling, syncope and near-syncope.  Gastrointestinal: Negative for abdominal pain, blood in stool, constipation, diarrhea, heartburn, melena, nausea and vomiting.  Genitourinary: Negative  for dysuria, frequency, hematuria and urgency.  Musculoskeletal: Negative for back pain, joint pain, myalgias and neck pain.  Skin: Negative for rash.  Neurological: Positive for dizziness. Negative for tingling, sensory change, focal weakness, weakness, numbness and headaches.  Endo/Heme/Allergies: Negative for environmental allergies and polydipsia. Does not bruise/bleed easily.  Psychiatric/Behavioral: Negative for depression and suicidal ideas. The patient is not nervous/anxious and does not have insomnia.      Objective  Vitals:   08/18/16 1406  BP: 138/80  Pulse: 78  Weight: 135 lb (61.2 kg)  Height: 5\' 1"  (1.549 m)    Physical Exam  Constitutional: She is well-developed, well-nourished, and in no distress. No distress.  HENT:  Head: Normocephalic and atraumatic.  Right Ear: External ear normal.  Left Ear: External ear normal.  Nose: Nose normal.  Mouth/Throat: Oropharynx is clear and moist.  Eyes: Conjunctivae and EOM are normal. Pupils are equal, round, and reactive to light. Right eye exhibits no discharge. Left eye exhibits no discharge.  Neck: Normal range of motion. Neck supple. No JVD present. No thyromegaly present.  Cardiovascular: Normal rate, regular rhythm, normal heart sounds and intact distal pulses.  Exam reveals no gallop and no friction rub.   No murmur heard. Pulmonary/Chest: Effort normal and breath sounds normal. She has no wheezes. She has no rales.  Abdominal: Soft. Bowel sounds are normal. She exhibits no mass. There is no tenderness. There is no guarding.  Musculoskeletal: Normal range of motion. She exhibits no edema.  Lymphadenopathy:    She has no cervical adenopathy.  Neurological: She is alert.  Skin: Skin is warm and dry. She is not diaphoretic.  Psychiatric: Mood and affect normal.      Assessment & Plan  Problem List Items Addressed This Visit      Cardiovascular and Mediastinum   BP (high blood pressure)   Relevant Medications    metoprolol tartrate (LOPRESSOR) 25 MG tablet    Other Visit Diagnoses    Palpitations    -  Primary   spoke with Dr Mina Marble pt to set up appt for application of Holter [id number] current dose of metoprolol   Relevant Orders   EKG 12-Lead (Completed)      No orders of the defined types were placed in this encounter. placed a call to Dr Leland Her office for pt to be contacted for a possible holter monitor    Dr. Otilio Miu [facility]  08/18/16

## 2016-09-26 ENCOUNTER — Other Ambulatory Visit: Payer: Self-pay

## 2016-09-26 ENCOUNTER — Other Ambulatory Visit: Payer: Self-pay | Admitting: Family Medicine

## 2016-09-26 DIAGNOSIS — I10 Essential (primary) hypertension: Secondary | ICD-10-CM

## 2016-09-26 MED ORDER — METOPROLOL SUCCINATE ER 50 MG PO TB24: 50.0000 mg | ORAL_TABLET | Freq: Every day | ORAL | 0 refills | Status: DC

## 2016-12-11 ENCOUNTER — Ambulatory Visit (INDEPENDENT_AMBULATORY_CARE_PROVIDER_SITE_OTHER): Payer: BLUE CROSS/BLUE SHIELD | Admitting: Family Medicine

## 2016-12-11 VITALS — BP 100/60 | HR 72 | Temp 99.0°F | Ht 61.0 in | Wt 139.0 lb

## 2016-12-11 DIAGNOSIS — R197 Diarrhea, unspecified: Secondary | ICD-10-CM

## 2016-12-11 DIAGNOSIS — R1030 Lower abdominal pain, unspecified: Secondary | ICD-10-CM | POA: Diagnosis not present

## 2016-12-11 LAB — HEMOCCULT GUIAC POC 1CARD (OFFICE): FECAL OCCULT BLD: NEGATIVE

## 2016-12-11 LAB — POCT URINALYSIS DIPSTICK
BILIRUBIN UA: NEGATIVE
GLUCOSE UA: NEGATIVE
Ketones, UA: NEGATIVE
Leukocytes, UA: NEGATIVE
NITRITE UA: NEGATIVE
Protein, UA: NEGATIVE
RBC UA: NEGATIVE
Spec Grav, UA: 1.01 (ref 1.010–1.025)
Urobilinogen, UA: 0.2 E.U./dL
pH, UA: 5 (ref 5.0–8.0)

## 2016-12-11 NOTE — Progress Notes (Signed)
Name: Leslie Silva   MRN: [id number]    DOB: 25-Apr-1951   Date:12/11/2016       Progress Note  Subjective  Chief Complaint  Chief Complaint  Patient presents with  . Abdominal Pain    had off and on episodes of abdominal pain and diarrhea along with acid reflux/ burning. As long as "I don't eat, I don't have to go to bathroom"    Abdominal Pain  This is a new problem. The current episode started in the past 7 days (onset Saturday night). The onset quality is sudden. The problem occurs constantly (every 2-3 hours). The problem has been waxing and waning. The pain is located in the suprapubic region. The pain is at a severity of 5/10. The pain is moderate. The quality of the pain is cramping and sharp. The abdominal pain does not radiate. Associated symptoms include diarrhea, a fever and flatus. Pertinent negatives include no constipation, dysuria, frequency, headaches, hematochezia, hematuria, melena, myalgias, nausea, vomiting or weight loss. The pain is aggravated by eating. She has tried nothing for the symptoms. Prior diagnostic workup includes lower endoscopy (diverticulosis). There is no history of abdominal surgery, colon cancer, Crohn's disease, gallstones, GERD, irritable bowel syndrome, PUD or ulcerative colitis.  Diarrhea   This is a new problem. The current episode started in the past 7 days. The problem occurs 5 to 10 times per day. The problem has been waxing and waning. The stool consistency is described as watery. The patient states that diarrhea awakens her from sleep. Associated symptoms include abdominal pain, a fever and increased flatus. Pertinent negatives include no chills, coughing, headaches, myalgias, sweats, vomiting or weight loss. She has tried nothing for the symptoms. The treatment provided moderate relief. There is no history of bowel resection, inflammatory bowel disease, irritable bowel syndrome, malabsorption, a recent abdominal surgery or short gut syndrome.     No problem-specific Assessment & Plan notes found for this encounter.   Past Medical History:  Diagnosis Date  . Arthritis   . Hypertension     Past Surgical History:  Procedure Laterality Date  . BACK SURGERY  2011  . CATARACT EXTRACTION Bilateral   . COLONOSCOPY  2015   cleared for 10 yrs- divert- Dr Eliberto Ivory [facility]  . FOOT NEUROMA SURGERY Left 2006    Family History  Problem Relation Age of Onset  . Cancer Mother     Social History   Social History  . Marital status: Married    Spouse name: N/A  . Number of children: N/A  . Years of education: N/A   Occupational History  . Not on file.   Social History Main Topics  . Smoking status: Never Smoker  . Smokeless tobacco: Not on file  . Alcohol use No  . Drug use: No  . Sexual activity: Yes   Other Topics Concern  . Not on file   Social History Narrative  . No narrative on file    Allergies  Allergen Reactions  . Sulfa Antibiotics Swelling  . Vioxx [Rofecoxib]   . Penicillins Rash  . Tape Rash    Outpatient Medications Prior to Visit  Medication Sig Dispense Refill  . albuterol-ipratropium (COMBIVENT) 18-103 MCG/ACT inhaler Inhale 1 puff into the lungs as needed. 1 Inhaler 11  . conjugated estrogens (PREMARIN) vaginal cream Place 1 Applicatorful vaginally 2 (two) times a week. 42.5 g 11  . ibuprofen (ADVIL,MOTRIN) 200 MG tablet Take 1 tablet by mouth as needed.    . metoprolol  succinate (TOPROL-XL) 50 MG 24 hr tablet TAKE 1 TABLET BY MOUTH DAILY WITH OR IMMEDIATELY FOLLOWING A MEAL 30 tablet 0  . metoprolol tartrate (LOPRESSOR) 25 MG tablet Take 1 tablet by mouth 2 (two) times daily. cardiologist     Facility-Administered Medications Prior to Visit  Medication Dose Route Frequency Provider Last Rate Last Dose  . albuterol (PROVENTIL) (2.5 MG/3ML) 0.083% nebulizer solution 2.5 mg  2.5 mg Nebulization Once Juline Patch, MD        Review of Systems  Constitutional: Positive for fever. Negative for  chills, malaise/fatigue and weight loss.  HENT: Negative for ear discharge, ear pain and sore throat.   Eyes: Negative for blurred vision.  Respiratory: Negative for cough, sputum production, shortness of breath and wheezing.   Cardiovascular: Negative for chest pain, palpitations and leg swelling.  Gastrointestinal: Positive for abdominal pain, diarrhea and flatus. Negative for blood in stool, constipation, heartburn, hematochezia, melena, nausea and vomiting.  Genitourinary: Negative for dysuria, frequency, hematuria and urgency.  Musculoskeletal: Negative for back pain, joint pain, myalgias and neck pain.  Skin: Negative for rash.  Neurological: Negative for dizziness, tingling, sensory change, focal weakness and headaches.  Endo/Heme/Allergies: Negative for environmental allergies and polydipsia. Does not bruise/bleed easily.  Psychiatric/Behavioral: Negative for depression and suicidal ideas. The patient is not nervous/anxious and does not have insomnia.      Objective  Vitals:   12/11/16 1405  BP: 100/60  Pulse: 72  Temp: 99 F (37.2 C)  Weight: 139 lb (63 kg)  Height: 5\' 1"  (1.549 m)    Physical Exam  Constitutional: She is well-developed, well-nourished, and in no distress. No distress.  HENT:  Head: Normocephalic and atraumatic.  Right Ear: External ear normal.  Left Ear: External ear normal.  Nose: Nose normal.  Mouth/Throat: Oropharynx is clear and moist.  Eyes: Conjunctivae and EOM are normal. Pupils are equal, round, and reactive to light. Right eye exhibits no discharge. Left eye exhibits no discharge.  Neck: Normal range of motion. Neck supple. No JVD present. No thyromegaly present.  Cardiovascular: Normal rate, regular rhythm, normal heart sounds and intact distal pulses.  Exam reveals no gallop and no friction rub.   No murmur heard. Pulmonary/Chest: Effort normal and breath sounds normal. She has no wheezes. She has no rales.  Abdominal: Soft. Bowel sounds  are normal. She exhibits no mass. There is no hepatosplenomegaly. There is no tenderness. There is no rigidity, no rebound, no guarding and no CVA tenderness.  Musculoskeletal: Normal range of motion. She exhibits no edema.  Lymphadenopathy:    She has no cervical adenopathy.  Neurological: She is alert. She has normal reflexes.  Skin: Skin is warm and dry. She is not diaphoretic.  Psychiatric: Mood and affect normal.  Nursing note and vitals reviewed.     Assessment & Plan  Problem List Items Addressed This Visit    None    Visit Diagnoses    Diarrhea in adult patient    -  Primary   Relevant Orders   CBC w/Diff/Platelet   POCT occult blood stool (Completed)   GI Profile, Stool, PCR   Lower abdominal pain       Relevant Orders   POCT urinalysis dipstick (Completed)      No orders of the defined types were placed in this encounter.     Dr. [facility]  12/11/16

## 2016-12-11 NOTE — Patient Instructions (Signed)

## 2016-12-12 LAB — CBC WITH DIFFERENTIAL/PLATELET
BASOS ABS: 0 10*3/uL (ref 0.0–0.2)
Basos: 0 %
EOS (ABSOLUTE): 0.1 10*3/uL (ref 0.0–0.4)
Eos: 1 %
Hematocrit: 40.6 % (ref 34.0–46.6)
Hemoglobin: 14 g/dL (ref 11.1–15.9)
IMMATURE GRANS (ABS): 0 10*3/uL (ref 0.0–0.1)
IMMATURE GRANULOCYTES: 0 %
LYMPHS: 27 %
Lymphocytes Absolute: 1.1 10*3/uL (ref 0.7–3.1)
MCH: 33 pg (ref 26.6–33.0)
MCHC: 34.5 g/dL (ref 31.5–35.7)
MCV: 96 fL (ref 79–97)
Monocytes Absolute: 0.3 10*3/uL (ref 0.1–0.9)
Monocytes: 8 %
NEUTROS PCT: 64 %
Neutrophils Absolute: 2.5 10*3/uL (ref 1.4–7.0)
PLATELETS: 193 10*3/uL (ref 150–379)
RBC: 4.24 x10E6/uL (ref 3.77–5.28)
RDW: 13.3 % (ref 12.3–15.4)
WBC: 3.9 10*3/uL (ref 3.4–10.8)

## 2016-12-15 LAB — GI PROFILE, STOOL, PCR
ADENOVIRUS F 40/41: NOT DETECTED
Astrovirus: NOT DETECTED
C difficile toxin A/B: NOT DETECTED
CRYPTOSPORIDIUM: NOT DETECTED
CYCLOSPORA CAYETANENSIS: NOT DETECTED
Campylobacter: NOT DETECTED
Entamoeba histolytica: NOT DETECTED
Enteroaggregative E coli: NOT DETECTED
Enteropathogenic E coli: DETECTED — AB
Enterotoxigenic E coli: NOT DETECTED
Giardia lamblia: NOT DETECTED
Norovirus GI/GII: NOT DETECTED
PLESIOMONAS SHIGELLOIDES: NOT DETECTED
ROTAVIRUS A: NOT DETECTED
SALMONELLA: NOT DETECTED
SAPOVIRUS: NOT DETECTED
SHIGA-TOXIN-PRODUCING E COLI: NOT DETECTED
Shigella/Enteroinvasive E coli: NOT DETECTED
VIBRIO: NOT DETECTED
Vibrio cholerae: NOT DETECTED
Yersinia enterocolitica: NOT DETECTED

## 2016-12-21 ENCOUNTER — Encounter: Payer: Self-pay | Admitting: Family Medicine

## 2016-12-21 ENCOUNTER — Ambulatory Visit (INDEPENDENT_AMBULATORY_CARE_PROVIDER_SITE_OTHER): Payer: BLUE CROSS/BLUE SHIELD | Admitting: Family Medicine

## 2016-12-21 VITALS — BP 126/86 | HR 68 | Ht 62.0 in | Wt 138.0 lb

## 2016-12-21 DIAGNOSIS — F5102 Adjustment insomnia: Secondary | ICD-10-CM

## 2016-12-21 DIAGNOSIS — F4320 Adjustment disorder, unspecified: Secondary | ICD-10-CM | POA: Diagnosis not present

## 2016-12-21 DIAGNOSIS — F4321 Adjustment disorder with depressed mood: Secondary | ICD-10-CM

## 2016-12-21 MED ORDER — ALPRAZOLAM 0.25 MG PO TABS: 0.2500 mg | ORAL_TABLET | Freq: Every day | ORAL | 0 refills | Status: DC

## 2016-12-21 NOTE — Progress Notes (Signed)
Name: Leslie Silva   MRN: [id number]    DOB: Nov 24, 1950   Date:12/21/2016       Progress Note  Subjective  Chief Complaint  Chief Complaint  Patient presents with  . Tingling    Pt stated having tingling on the face    Depression         This is a new problem.  The current episode started in the past 7 days.   The onset quality is sudden.   The problem has been gradually worsening since onset.  Associated symptoms include insomnia and sad.  Associated symptoms include no helplessness, no hopelessness, not irritable, no restlessness, no decreased interest, no myalgias, no headaches and no suicidal ideas.     The symptoms are aggravated by family issues (mother in law).   No problem-specific Assessment & Plan notes found for this encounter.   Past Medical History:  Diagnosis Date  . Arthritis   . Hypertension     Past Surgical History:  Procedure Laterality Date  . BACK SURGERY  2011  . CATARACT EXTRACTION Bilateral   . COLONOSCOPY  2015   cleared for 10 yrs- divert- Dr Eliberto Ivory [facility]  . FOOT NEUROMA SURGERY Left 2006    Family History  Problem Relation Age of Onset  . Cancer Mother     Social History   Social History  . Marital status: Married    Spouse name: N/A  . Number of children: N/A  . Years of education: N/A   Occupational History  . Not on file.   Social History Main Topics  . Smoking status: Never Smoker  . Smokeless tobacco: Never Used  . Alcohol use No  . Drug use: No  . Sexual activity: Yes   Other Topics Concern  . Not on file   Social History Narrative  . No narrative on file    Allergies  Allergen Reactions  . Sulfa Antibiotics Swelling  . Vioxx [Rofecoxib]   . Penicillins Rash  . Tape Rash    Outpatient Medications Prior to Visit  Medication Sig Dispense Refill  . albuterol-ipratropium (COMBIVENT) 18-103 MCG/ACT inhaler Inhale 1 puff into the lungs as needed. 1 Inhaler 11  . conjugated estrogens (PREMARIN) vaginal cream Place 1  Applicatorful vaginally 2 (two) times a week. 42.5 g 11  . ibuprofen (ADVIL,MOTRIN) 200 MG tablet Take 1 tablet by mouth as needed.    . metoprolol succinate (TOPROL-XL) 50 MG 24 hr tablet TAKE 1 TABLET BY MOUTH DAILY WITH OR IMMEDIATELY FOLLOWING A MEAL 30 tablet 0  . metoprolol tartrate (LOPRESSOR) 25 MG tablet Take 1 tablet by mouth 2 (two) times daily. cardiologist     Facility-Administered Medications Prior to Visit  Medication Dose Route Frequency Provider Last Rate Last Dose  . albuterol (PROVENTIL) (2.5 MG/3ML) 0.083% nebulizer solution 2.5 mg  2.5 mg Nebulization Once Juline Patch, MD        Review of Systems  Constitutional: Negative for chills, fever, malaise/fatigue and weight loss.  HENT: Negative for ear discharge, ear pain and sore throat.   Eyes: Negative for blurred vision.  Respiratory: Negative for cough, sputum production, shortness of breath and wheezing.   Cardiovascular: Negative for chest pain, palpitations and leg swelling.  Gastrointestinal: Negative for abdominal pain, blood in stool, constipation, diarrhea, heartburn, melena and nausea.  Genitourinary: Negative for dysuria, frequency, hematuria and urgency.  Musculoskeletal: Negative for back pain, joint pain, myalgias and neck pain.  Skin: Negative for rash.  Neurological: Negative for  dizziness, tingling, sensory change, focal weakness and headaches.  Endo/Heme/Allergies: Negative for environmental allergies and polydipsia. Does not bruise/bleed easily.  Psychiatric/Behavioral: Positive for depression. Negative for suicidal ideas. The patient has insomnia. The patient is not nervous/anxious.      Objective  Vitals:   12/21/16 1026  BP: 126/86  Pulse: 68  SpO2: 97%  Weight: 138 lb (62.6 kg)  Height: 5\' 2"  (1.575 m)    Physical Exam  Constitutional: She is well-developed, well-nourished, and in no distress. She is not irritable. No distress.  HENT:  Head: Normocephalic and atraumatic.  Right  Ear: External ear normal.  Left Ear: External ear normal.  Nose: Nose normal.  Mouth/Throat: Oropharynx is clear and moist.  Eyes: Conjunctivae and EOM are normal. Pupils are equal, round, and reactive to light. Right eye exhibits no discharge. Left eye exhibits no discharge.  Neck: Normal range of motion. Neck supple. No JVD present. No thyromegaly present.  Cardiovascular: Normal rate, regular rhythm, normal heart sounds and intact distal pulses.  Exam reveals no gallop and no friction rub.   No murmur heard. Pulmonary/Chest: Effort normal and breath sounds normal. She has no wheezes. She has no rales.  Abdominal: Soft. Bowel sounds are normal. She exhibits no distension and no mass. There is no tenderness. There is no guarding.  Musculoskeletal: Normal range of motion. She exhibits no edema.  Lymphadenopathy:    She has no cervical adenopathy.  Neurological: She is alert.  Skin: Skin is warm and dry. She is not diaphoretic.  Psychiatric: Mood and affect normal.  Nursing note and vitals reviewed.     Assessment & Plan  Problem List Items Addressed This Visit    None    Visit Diagnoses    Grief reaction    -  Primary   Adjustment insomnia          Meds ordered this encounter  Medications  . ALPRAZolam (XANAX) 0.25 MG tablet    Sig: Take 1 tablet (0.25 mg total) by mouth at bedtime.    Dispense:  15 tablet    Refill:  0      Dr. Deanna Jones [facility]  12/21/16

## 2016-12-21 NOTE — Patient Instructions (Signed)

## 2017-01-08 DIAGNOSIS — L814 Other melanin hyperpigmentation: Secondary | ICD-10-CM | POA: Diagnosis not present

## 2017-01-08 DIAGNOSIS — Z85828 Personal history of other malignant neoplasm of skin: Secondary | ICD-10-CM | POA: Diagnosis not present

## 2017-01-08 DIAGNOSIS — L820 Inflamed seborrheic keratosis: Secondary | ICD-10-CM | POA: Diagnosis not present

## 2017-01-08 DIAGNOSIS — D1801 Hemangioma of skin and subcutaneous tissue: Secondary | ICD-10-CM | POA: Diagnosis not present

## 2017-01-08 DIAGNOSIS — L570 Actinic keratosis: Secondary | ICD-10-CM | POA: Diagnosis not present

## 2017-01-12 ENCOUNTER — Encounter: Payer: Self-pay | Admitting: Certified Nurse Midwife

## 2017-01-12 ENCOUNTER — Ambulatory Visit (INDEPENDENT_AMBULATORY_CARE_PROVIDER_SITE_OTHER): Payer: Medicare HMO | Admitting: Certified Nurse Midwife

## 2017-01-12 VITALS — BP 108/68 | HR 66 | Ht 62.0 in | Wt 138.0 lb

## 2017-01-12 DIAGNOSIS — Z01419 Encounter for gynecological examination (general) (routine) without abnormal findings: Secondary | ICD-10-CM

## 2017-01-12 DIAGNOSIS — Z124 Encounter for screening for malignant neoplasm of cervix: Secondary | ICD-10-CM

## 2017-01-12 DIAGNOSIS — Z13820 Encounter for screening for osteoporosis: Secondary | ICD-10-CM

## 2017-01-12 DIAGNOSIS — Z1231 Encounter for screening mammogram for malignant neoplasm of breast: Secondary | ICD-10-CM

## 2017-01-12 DIAGNOSIS — Z1239 Encounter for other screening for malignant neoplasm of breast: Secondary | ICD-10-CM

## 2017-01-12 NOTE — Progress Notes (Signed)
Gynecology Annual Exam  PCP: Juline Patch, MD  Chief Complaint:  Chief Complaint  Patient presents with  . Gynecologic Exam    History of Present Illness: Leslie Silva is a 66 y.o.G3 [id number] postmenopausal female who presents for her annual exam. The patient has no complaints today. Last pap smear: 02/18/2015, results were ASCUS/ neg HRHPV. She has a remote history of abnormal Pap smears   The patient is sexually active. She has intercourse infrequently due to husband's medical problems (diabetes, prostate cancer, heart problems).  She does not have dyspareunia with the use of Premarin vaginal cream (usually 1x/week)  Since her last visit, she has had no significant changes in her health.  Her past medical history is remarkable for arthritis, asthma, hypertension, cardiac conduc tion disorder/palpitations/ MVP, diverticulosis, GERD, and basal cell carcinoma  The patient does not perform self breast exams. Her last mammogram was 02/18/2015, results were negative.   There is no family history of breast cancer.   There is no family history of ovarian cancer.   The patient denies smoking.  She reports drinking alcohol. She reports have 1-2 drinks per week.   She denies illegal drug use.  The patient reports exercising regularly.  The patient denies current symptoms of depression.    Review of Systems: Review of Systems  Constitutional: Negative for chills, fever and weight loss.  HENT: Negative for congestion, sinus pain and sore throat.   Eyes: Negative for blurred vision and pain.  Respiratory: Negative for hemoptysis, shortness of breath and wheezing.   Cardiovascular: Positive for palpitations. Negative for chest pain and leg swelling.  Gastrointestinal: Negative for abdominal pain, blood in stool, diarrhea, heartburn, nausea and vomiting.  Genitourinary: Negative for dysuria, frequency, hematuria and urgency.  Musculoskeletal: Negative for back pain, joint pain  and myalgias.  Skin: Negative for itching and rash.  Neurological: Negative for dizziness, tingling and headaches.  Endo/Heme/Allergies: Negative for environmental allergies and polydipsia. Does not bruise/bleed easily.       Negative for hirsutism   Psychiatric/Behavioral: Negative for depression. The patient has insomnia. The patient is not nervous/anxious.     Past Medical History:  Past Medical History:  Diagnosis Date  . Arthritis   . Asthma   . Breast mass   . Depression   . Hypertension   . Mitral valve prolapse     Past Surgical History:  Past Surgical History:  Procedure Laterality Date  . BACK SURGERY  2011  . BASAL CELL CARCINOMA EXCISION  12/2013  . CATARACT EXTRACTION Bilateral   . COLONOSCOPY  2016   cleared for 10 yrs- divert- Dr Eliberto Ivory [facility]  . FOOT NEUROMA SURGERY Left 2006    Family History:  Family History  Problem Relation Age of Onset  . Cancer Mother   . Liver cancer Mother 64  . Heart disease Father   . Asthma Brother   . Liver cancer Brother 12  . Heart disease Maternal Aunt        aortic valve replacement  . Heart disease Cousin        aortic valve replacement    Social History:  Social History   Social History  . Marital status: Married    Spouse name: N/A  . Number of children: 2  . Years of education: N/A   Occupational History  . admin assistant    Social History Main Topics  . Smoking status: Never Smoker  . Smokeless tobacco: Never Used  .  Alcohol use 1.2 oz/week    2 Glasses of wine per week  . Drug use: No  . Sexual activity: Yes    Partners: Male    Birth control/ protection: Post-menopausal   Other Topics Concern  . Not on file   Social History Narrative  . No narrative on file    Allergies:  Allergies  Allergen Reactions  . Sulfa Antibiotics Swelling  . Vioxx [Rofecoxib]   . Penicillins Rash  . Tape Rash    Medications: Prior to Admission medications   Medication Sig Start Date End Date Taking?  Authorizing Provider  albuterol-ipratropium (COMBIVENT) 18-103 MCG/ACT inhaler Inhale 1 puff into the lungs as needed. 04/07/16   Juline Patch, MD  ALPRAZolam Duanne Moron) 0.25 MG tablet Take 1 tablet (0.25 mg total) by mouth at bedtime. 12/21/16   Juline Patch, MD  conjugated estrogens (PREMARIN) vaginal cream Place 1 Applicatorful vaginally 2 (two) times a week. 04/10/16   Juline Patch, MD  ibuprofen (ADVIL,MOTRIN) 200 MG tablet Take 1 tablet by mouth as needed.    [provider]  metoprolol succinate (TOPROL-XL) 50 MG 24 hr tablet TAKE 1 TABLET BY MOUTH DAILY WITH OR IMMEDIATELY FOLLOWING A MEAL 09/26/16   Juline Patch, MD  metoprolol tartrate (LOPRESSOR) 25 MG tablet Take 1 tablet by mouth 2 (two) times daily. Cardiologist prn palpitations 03/03/16 03/03/17  [provider]    Physical Exam Vitals: BP 108/68   Pulse 66   Ht 5\' 2"  (1.575 m)   Wt 138 lb (62.6 kg)   BMI 25.24 kg/m   General: pleasant WF in NAD HEENT: normocephalic, anicteric Neck: no thyroid enlargement, no palpable nodules, no cervical lymphadenopathy  Pulmonary: No increased work of breathing, CTAB Cardiovascular: RRR, without murmur  Breast: Breast symmetrical, no tenderness, no palpable nodules or masses, no skin or nipple retraction present, no nipple discharge.  No axillary, infraclavicular or supraclavicular lymphadenopathy. Abdomen: Soft, non-tender, non-distended.  Umbilicus without lesions.  No hepatomegaly or masses palpable. No evidence of hernia. Genitourinary:  External: Normal external female genitalia.  Normal urethral meatus, normal Bartholin's and Skene's glands.    Vagina: flattened rugae, pale, no evidence of prolapse.    Cervix: Grossly normal in appearance, friable cervix with Pap, non-tender  Uterus: MP to RV, normal size, shape, and consistency, mobile, and non-tender  Adnexa: No adnexal masses, non-tender  Rectal: deferred  Lymphatic: no evidence of inguinal  lymphadenopathy Extremities: no edema, erythema, or tenderness Neurologic: Grossly intact Psychiatric: mood appropriate, affect full     Assessment: 66 y.o. postmenopausal female annual gyn exam  Plan:   1) Breast cancer screening - recommend monthly self breast exam.and annual screening mamograms Mammogram was ordered today. Patient to call [facility] [facility] to schedule  2) Cervical cancer screening - Pap was done.   3) Osteoporosis prevention-discussed calcium and vitamin D requirements, exercises. DEXA scan ordered  4) Routine healthcare maintenance including cholesterol and diabetes screening managed by PCP   5) Colon cancer screening: colonoscopy in 2016, next due in 10 years?  6) RTO in 2 years for annual   Dalia Heading, [place]

## 2017-01-13 ENCOUNTER — Encounter: Payer: Self-pay | Admitting: Certified Nurse Midwife

## 2017-01-13 DIAGNOSIS — G4700 Insomnia, unspecified: Secondary | ICD-10-CM | POA: Insufficient documentation

## 2017-01-13 DIAGNOSIS — J45909 Unspecified asthma, uncomplicated: Secondary | ICD-10-CM | POA: Insufficient documentation

## 2017-01-15 ENCOUNTER — Telehealth: Payer: Self-pay | Admitting: Certified Nurse Midwife

## 2017-01-15 NOTE — Telephone Encounter (Signed)
Patient is aware of appointment at [facility] on Monday 02/19/17 @ 10:20am for bone density and mammogram.

## 2017-01-15 NOTE — Telephone Encounter (Signed)
-----   Message from Dalia Heading, [place] sent at 01/13/2017  7:52 AM EDT ----- Regarding: referral for DEXA Please refer to [facility] for bone density scan

## 2017-01-16 LAB — PAP IG (IMAGE GUIDED): PAP SMEAR COMMENT: 0

## 2017-01-24 ENCOUNTER — Other Ambulatory Visit: Payer: Self-pay

## 2017-01-29 DIAGNOSIS — L218 Other seborrheic dermatitis: Secondary | ICD-10-CM | POA: Diagnosis not present

## 2017-01-29 DIAGNOSIS — L570 Actinic keratosis: Secondary | ICD-10-CM | POA: Diagnosis not present

## 2017-01-29 DIAGNOSIS — L820 Inflamed seborrheic keratosis: Secondary | ICD-10-CM | POA: Diagnosis not present

## 2017-02-14 ENCOUNTER — Emergency Department
Admission: EM | Admit: 2017-02-14 | Discharge: 2017-02-14 | Disposition: A | Discharge status: Home / Self Care | Payer: Medicare HMO | Attending: Emergency Medicine | Admitting: Emergency Medicine

## 2017-02-14 ENCOUNTER — Encounter: Payer: Self-pay | Admitting: Emergency Medicine

## 2017-02-14 DIAGNOSIS — Y998 Other external cause status: Secondary | ICD-10-CM | POA: Insufficient documentation

## 2017-02-14 DIAGNOSIS — Z79899 Other long term (current) drug therapy: Secondary | ICD-10-CM | POA: Diagnosis not present

## 2017-02-14 DIAGNOSIS — I10 Essential (primary) hypertension: Secondary | ICD-10-CM | POA: Diagnosis not present

## 2017-02-14 DIAGNOSIS — W01198A Fall on same level from slipping, tripping and stumbling with subsequent striking against other object, initial encounter: Secondary | ICD-10-CM | POA: Insufficient documentation

## 2017-02-14 DIAGNOSIS — Y9289 Other specified places as the place of occurrence of the external cause: Secondary | ICD-10-CM | POA: Diagnosis not present

## 2017-02-14 DIAGNOSIS — S66422A Laceration of intrinsic muscle, fascia and tendon of left thumb at wrist and hand level, initial encounter: Secondary | ICD-10-CM | POA: Diagnosis not present

## 2017-02-14 DIAGNOSIS — J45909 Unspecified asthma, uncomplicated: Secondary | ICD-10-CM | POA: Diagnosis not present

## 2017-02-14 DIAGNOSIS — S61012A Laceration without foreign body of left thumb without damage to nail, initial encounter: Secondary | ICD-10-CM | POA: Diagnosis present

## 2017-02-14 DIAGNOSIS — S61011A Laceration without foreign body of right thumb without damage to nail, initial encounter: Secondary | ICD-10-CM

## 2017-02-14 DIAGNOSIS — Y9301 Activity, walking, marching and hiking: Secondary | ICD-10-CM | POA: Insufficient documentation

## 2017-02-14 NOTE — ED Notes (Signed)
See triage note  states she tripped fell. Laceration noted to base of left thumb

## 2017-02-14 NOTE — ED Provider Notes (Signed)
[facility] Emergency Department Provider Note   ____________________________________________   First MD Initiated Contact with Patient 02/14/17 1100     (approximate)  I have reviewed the triage vital signs and the nursing notes.   HISTORY  Chief Complaint Laceration    HPI Leslie Silva is a 66 y.o. female patient complaining of laceration to the thenar fossa left hand. Patient states she tripped and fell today cutting thumb on a wooden closet. Patient denies loss sensation or loss of function of the affected digit. Bleeding is controlled direct pressure. Patient is right-hand dominant. Patient rates the pain as a 2/10. Patient described a pain as "achy".  Past Medical History:  Diagnosis Date  . Arthritis   . Asthma   . Breast mass   . Depression   . Hypertension   . Mitral valve prolapse     Patient Active Problem List   Diagnosis Date Noted  . Asthma 01/13/2017  . Insomnia 01/13/2017  . Arthritis 03/12/2015  . Acid reflux 03/12/2015  . BP (high blood pressure) 03/12/2015  . Disc disorder 03/12/2015  . Cardiac conduction disorder 05/19/2014  . Basal cell carcinoma 12/24/2013  . Diverticulitis 08/07/2013    Past Surgical History:  Procedure Laterality Date  . BACK SURGERY  2011  . BASAL CELL CARCINOMA EXCISION  12/2013  . CATARACT EXTRACTION Bilateral   . COLONOSCOPY  2016   cleared for 10 yrs- divert- Dr Eliberto Ivory [facility]  . FOOT NEUROMA SURGERY Left 2006    Prior to Admission medications   Medication Sig Start Date End Date Taking? Authorizing Provider  albuterol-ipratropium (COMBIVENT) 18-103 MCG/ACT inhaler Inhale 1 puff into the lungs as needed. 04/07/16   Juline Patch, MD  ALPRAZolam Duanne Moron) 0.25 MG tablet Take 1 tablet (0.25 mg total) by mouth at bedtime. 12/21/16   Juline Patch, MD  conjugated estrogens (PREMARIN) vaginal cream Place 1 Applicatorful vaginally 2 (two) times a week. 04/10/16   Juline Patch, MD  ibuprofen  (ADVIL,MOTRIN) 200 MG tablet Take 1 tablet by mouth as needed.    [provider]  metoprolol succinate (TOPROL-XL) 50 MG 24 hr tablet TAKE 1 TABLET BY MOUTH DAILY WITH OR IMMEDIATELY FOLLOWING A MEAL 09/26/16   Juline Patch, MD  metoprolol tartrate (LOPRESSOR) 25 MG tablet Take 1 tablet by mouth 2 (two) times daily. cardiologist 03/03/16 03/03/17  [provider]    Allergies Sulfa antibiotics; Vioxx [rofecoxib]; Penicillins; and Tape  Family History  Problem Relation Age of Onset  . Cancer Mother   . Liver cancer Mother 2  . Heart disease Father   . Asthma Brother   . Liver cancer Brother 12  . Heart disease Maternal Aunt        aortic valve replacement  . Heart disease Cousin        aortic valve replacement    Social History Social History  Substance Use Topics  . Smoking status: Never Smoker  . Smokeless tobacco: Never Used  . Alcohol use 1.2 oz/week    2 Glasses of wine per week    Review of Systems  Constitutional: No fever/chills Eyes: No visual changes. ENT: No sore throat. Cardiovascular: Denies chest pain. Respiratory: Denies shortness of breath. Gastrointestinal: No abdominal pain.  No nausea, no vomiting.  No diarrhea.  No constipation. Genitourinary: Negative for dysuria. Musculoskeletal: Negative for back pain. Skin: Negative for rash. Neurological: Negative for headaches, focal weakness or numbness. Psychiatric:Depression Endocrine:Hypertension Allergic/Immunilogical: See medication list ____________________________________________  PHYSICAL EXAM:  VITAL SIGNS: ED Triage Vitals  Enc Vitals Group     BP 02/14/17 1031 (!) 170/88     Pulse Rate 02/14/17 1031 76     Resp 02/14/17 1031 18     Temp 02/14/17 1031 98.8 F (37.1 C)     Temp Source 02/14/17 1031 Oral     SpO2 02/14/17 1031 96 %     Weight 02/14/17 1032 135 lb (61.2 kg)     Height 02/14/17 1032 5\' 1"  (1.549 m)     Head Circumference --      Peak Flow --      Pain  Score --      Pain Loc --      Pain Edu? --      Excl. in [place]? --     Constitutional: Alert and oriented. Well appearing and in no acute distress. Cardiovascular: Normal rate, regular rhythm. Grossly normal heart sounds.  Good peripheral circulation. Respiratory: Normal respiratory effort.  No retractions. Lungs CTAB. Musculoskeletal: No lower extremity tenderness nor edema.  No joint effusions. Neurologic:  Normal speech and language. No gross focal neurologic deficits are appreciated. No gait instability. Skin:  0.5 cm laceration to the left thenar fossa.  Psychiatric: Mood and affect are normal. Speech and behavior are normal.  ____________________________________________   LABS (all labs ordered are listed, but only abnormal results are displayed)  Labs Reviewed - No data to display ____________________________________________  EKG   ____________________________________________  RADIOLOGY  No results found.  ____________________________________________   PROCEDURES  Procedure(s) performed: LACERATION REPAIR Performed by: Sable Feil Authorized by: Sable Feil Consent: Verbal consent obtained. Risks and benefits: risks, benefits and alternatives were discussed Consent given by: patient Patient identity confirmed: provided demographic data Prepped and Draped in normal sterile fashion Wound explored  Laceration Location: Left thenar fossa  Laceration Length: 0.5cm  No Foreign Bodies seen or palpated  Irrigation method: syringe Amount of cleaning: standard  Skin closure: Dermabond     Patient tolerance: Patient tolerated the procedure well with no immediate complications.   Procedures  Critical Care performed: No  ____________________________________________   INITIAL IMPRESSION / ASSESSMENT AND PLAN / ED COURSE  Pertinent labs & imaging results that were available during my care of the patient were reviewed by me and considered in my  medical decision making (see chart for details).  Left thenar fossa laceration. Patient given discharge care instruction. Patient advised return back to the ED if before healing is complete.      ____________________________________________   FINAL CLINICAL IMPRESSION(S) / ED DIAGNOSES  Final diagnoses:  Laceration of right thumb without foreign body without damage to nail, initial encounter      NEW MEDICATIONS STARTED DURING THIS VISIT:  Discharge Medication List as of 02/14/2017 10:59 AM       Note:  This document was prepared using Dragon voice recognition software and may include unintentional dictation errors.    Sable Feil, PA-C 02/14/17 1105    Earleen Newport, MD 02/14/17 [phone]

## 2017-02-14 NOTE — Discharge Instructions (Signed)
Keep fingers taped for 3 days as directed.

## 2017-02-14 NOTE — ED Triage Notes (Signed)
Pt reports tripped and fell into her closet this morning. Pt presents with small laceration to base of left thumb. Bleeding controlled. Pt denies dizziness prior to fall. Denies LOC or head injury.

## 2017-02-19 ENCOUNTER — Ambulatory Visit
Admission: RE | Admit: 2017-02-19 | Discharge: 2017-02-19 | Disposition: A | Discharge status: Home / Self Care | Payer: Medicare HMO | Source: Ambulatory Visit | Attending: Certified Nurse Midwife | Admitting: Certified Nurse Midwife

## 2017-02-19 ENCOUNTER — Encounter: Payer: Self-pay | Admitting: Certified Nurse Midwife

## 2017-02-19 DIAGNOSIS — Z13820 Encounter for screening for osteoporosis: Secondary | ICD-10-CM | POA: Insufficient documentation

## 2017-02-19 DIAGNOSIS — M810 Age-related osteoporosis without current pathological fracture: Secondary | ICD-10-CM | POA: Diagnosis not present

## 2017-02-19 DIAGNOSIS — Z780 Asymptomatic menopausal state: Secondary | ICD-10-CM | POA: Diagnosis not present

## 2017-02-19 DIAGNOSIS — Z1231 Encounter for screening mammogram for malignant neoplasm of breast: Secondary | ICD-10-CM | POA: Diagnosis not present

## 2017-02-19 DIAGNOSIS — Z1239 Encounter for other screening for malignant neoplasm of breast: Secondary | ICD-10-CM

## 2017-02-21 ENCOUNTER — Telehealth: Payer: Self-pay | Admitting: Family Medicine

## 2017-02-21 DIAGNOSIS — R69 Illness, unspecified: Secondary | ICD-10-CM | POA: Diagnosis not present

## 2017-02-21 NOTE — Telephone Encounter (Signed)
Pt needs refill sent to [facility] in [place].  ALPRAZolam Leslie Silva) 0.25 MG tablet [[id number]]

## 2017-02-22 ENCOUNTER — Other Ambulatory Visit: Payer: Self-pay | Admitting: Family Medicine

## 2017-02-22 MED ORDER — ALPRAZOLAM 0.25 MG PO TABS: 0.2500 mg | ORAL_TABLET | Freq: Every day | ORAL | 0 refills | Status: DC

## 2017-02-23 DIAGNOSIS — M810 Age-related osteoporosis without current pathological fracture: Secondary | ICD-10-CM | POA: Insufficient documentation

## 2017-03-02 ENCOUNTER — Ambulatory Visit (INDEPENDENT_AMBULATORY_CARE_PROVIDER_SITE_OTHER): Payer: Medicare HMO | Admitting: Family Medicine

## 2017-03-02 ENCOUNTER — Encounter: Payer: Self-pay | Admitting: Family Medicine

## 2017-03-02 VITALS — BP 120/80 | HR 64 | Temp 98.3°F | Ht 61.0 in | Wt 138.0 lb

## 2017-03-02 DIAGNOSIS — K5792 Diverticulitis of intestine, part unspecified, without perforation or abscess without bleeding: Secondary | ICD-10-CM | POA: Diagnosis not present

## 2017-03-02 DIAGNOSIS — B379 Candidiasis, unspecified: Secondary | ICD-10-CM

## 2017-03-02 MED ORDER — FLUCONAZOLE 150 MG PO TABS: 150.0000 mg | ORAL_TABLET | Freq: Once | ORAL | 0 refills | Status: AC

## 2017-03-02 MED ORDER — CIPROFLOXACIN HCL 500 MG PO TABS: 500.0000 mg | ORAL_TABLET | Freq: Two times a day (BID) | ORAL | 0 refills | Status: DC

## 2017-03-02 MED ORDER — METRONIDAZOLE 500 MG PO TABS: 500.0000 mg | ORAL_TABLET | Freq: Two times a day (BID) | ORAL | 0 refills | Status: DC

## 2017-03-02 NOTE — Patient Instructions (Signed)
Vitamin D Deficiency °Vitamin D deficiency is when your body does not have enough vitamin D. Vitamin D is important to your body for many reasons: °· It helps the body to absorb two important minerals, called calcium and phosphorus. °· It plays a role in bone health. °· It may help to prevent some diseases, such as diabetes and multiple sclerosis. °· It plays a role in muscle function, including heart function. ° °You can get vitamin D by: °· Eating foods that naturally contain vitamin D. °· Eating or drinking milk or other dairy products that have vitamin D added to them. °· Taking a vitamin D supplement or a multivitamin supplement that contains vitamin D. °· Being in the sun. Your body naturally makes vitamin D when your skin is exposed to sunlight. Your body changes the sunlight into a form of the vitamin that the body can use. ° °If vitamin D deficiency is severe, it can cause a condition in which your bones become soft. In adults, this condition is called osteomalacia. In children, this condition is called rickets. °What are the causes? °Vitamin D deficiency may be caused by: °· Not eating enough foods that contain vitamin D. °· Not getting enough sun exposure. °· Having certain digestive system diseases that make it difficult for your body to absorb vitamin D. These diseases include Crohn disease, chronic pancreatitis, and cystic fibrosis. °· Having a surgery in which a part of the stomach or a part of the small intestine is removed. °· Being obese. °· Having chronic kidney disease or liver disease. ° °What increases the risk? °This condition is more likely to develop in: °· Older people. °· People who do not spend much time outdoors. °· People who live in a long-term care facility. °· People who have had broken bones. °· People with weak or thin bones (osteoporosis). °· People who have a disease or condition that changes how the body absorbs vitamin D. °· People who have dark skin. °· People who take certain  medicines, such as steroid medicines or certain seizure medicines. °· People who are overweight or obese. ° °What are the signs or symptoms? °In mild cases of vitamin D deficiency, there may not be any symptoms. If the condition is severe, symptoms may include: °· Bone pain. °· Muscle pain. °· Falling often. °· Broken bones caused by a minor injury. ° °How is this diagnosed? °This condition is usually diagnosed with a blood test. °How is this treated? °Treatment for this condition may depend on what caused the condition. Treatment options include: °· Taking vitamin D supplements. °· Taking a calcium supplement. Your health care provider will suggest what dose is best for you. ° °Follow these instructions at home: °· Take medicines and supplements only as told by your health care provider. °· Eat foods that contain vitamin D. Choices include: °? Fortified dairy products, cereals, or juices. Fortified means that vitamin D has been added to the food. Check the label on the package to be sure. °? Fatty fish, such as salmon or trout. °? Eggs. °? Oysters. °· Do not use a tanning bed. °· Maintain a healthy weight. Lose weight, if needed. °· Keep all follow-up visits as told by your health care provider. This is important. °Contact a health care provider if: °· Your symptoms do not go away. °· You feel like throwing up (nausea) or you throw up (vomit). °· You have fewer bowel movements than usual or it is difficult for you to have a   bowel movement (constipation). This information is not intended to replace advice given to you by your health care provider. Make sure you discuss any questions you have with your health care provider. Document Released: 09/04/2011 Document Revised: 11/24/2015 Document Reviewed: 10/28/2014 Elsevier Interactive Patient Education  2018 [employer]. Osteoporosis Osteoporosis is the thinning and loss of density in the bones. Osteoporosis makes the bones more brittle, fragile, and likely to  break (fracture). Over time, osteoporosis can cause the bones to become so weak that they fracture after a simple fall. The bones most likely to fracture are the bones in the hip, wrist, and spine. What are the causes? The exact cause is not known. What increases the risk? Anyone can develop osteoporosis. You may be at greater risk if you have a family history of the condition or have poor nutrition. You may also have a higher risk if you are:  Female.  56 years old or older.  A smoker.  Not physically active.  White or Asian.  Slender.  What are the signs or symptoms? A fracture might be the first sign of the disease, especially if it results from a fall or injury that would not usually cause a bone to break. Other signs and symptoms include:  Low back and neck pain.  Stooped posture.  Height loss.  How is this diagnosed? To make a diagnosis, your health care provider may:  Take a medical history.  Perform a physical exam.  Order tests, such as: ? A bone mineral density test. ? A dual-energy X-ray absorptiometry test.  How is this treated? The goal of osteoporosis treatment is to strengthen your bones to reduce your risk of a fracture. Treatment may involve:  Making lifestyle changes, such as: ? Eating a diet rich in calcium. ? Doing weight-bearing and muscle-strengthening exercises. ? Stopping tobacco use. ? Limiting alcohol intake.  Taking medicine to slow the process of bone loss or to increase bone density.  Monitoring your levels of calcium and vitamin D.  Follow these instructions at home:  Include calcium and vitamin D in your diet. Calcium is important for bone health, and vitamin D helps the body absorb calcium.  Perform weight-bearing and muscle-strengthening exercises as directed by your health care provider.  Do not use any tobacco products, including cigarettes, chewing tobacco, and electronic cigarettes. If you need help quitting, ask your  health care provider.  Limit your alcohol intake.  Take medicines only as directed by your health care provider.  Keep all follow-up visits as directed by your health care provider. This is important.  Take precautions at home to lower your risk of falling, such as: ? Keeping rooms well lit and clutter free. ? Installing safety rails on stairs. ? Using rubber mats in the bathroom and other areas that are often wet or slippery. Get help right away if: You fall or injure yourself. This information is not intended to replace advice given to you by your health care provider. Make sure you discuss any questions you have with your health care provider. Document Released: 03/22/2005 Document Revised: 11/15/2015 Document Reviewed: 11/20/2013 Elsevier Interactive Patient Education  2017 [employer].

## 2017-03-02 NOTE — Progress Notes (Signed)
Name: Leslie Silva   MRN: [id number]    DOB: [birth year]   Date:03/02/2017       Progress Note  Subjective  Chief Complaint  Chief Complaint  Patient presents with  . Abdominal Pain    LLQ pain with chills and fever- had an episode of diarrhea but felt better after. Had mucus in it.    Abdominal Pain  This is a new problem. The current episode started yesterday. The onset quality is gradual. The problem occurs constantly. The problem has been gradually improving. The pain is located in the LLQ. The pain is moderate. The quality of the pain is dull. The abdominal pain radiates to the back. Associated symptoms include diarrhea and a fever. Pertinent negatives include no constipation, dysuria, frequency, headaches, hematochezia, hematuria, melena, myalgias, nausea or weight loss. The pain is relieved by nothing. The treatment provided mild relief. Prior diagnostic workup includes lower endoscopy.    No problem-specific Assessment & Plan notes found for this encounter.   Past Medical History:  Diagnosis Date  . Arthritis   . Asthma   . Breast mass   . Depression   . Hypertension   . Mitral valve prolapse     Past Surgical History:  Procedure Laterality Date  . BACK SURGERY  2011  . BASAL CELL CARCINOMA EXCISION  12/2013  . BREAST CYST ASPIRATION Left   . CATARACT EXTRACTION Bilateral   . COLONOSCOPY  2016   cleared for 10 yrs- divert- Dr Eliberto Ivory [facility]  . FOOT NEUROMA SURGERY Left 2006    Family History  Problem Relation Age of Onset  . Cancer Mother   . Liver cancer Mother 68  . Heart disease Father   . Asthma Brother   . Liver cancer Brother 37  . Heart disease Maternal Aunt        aortic valve replacement  . Heart disease Cousin        aortic valve replacement    Social History   Social History  . Marital status: Married    Spouse name: N/A  . Number of children: 2  . Years of education: N/A   Occupational History  . admin assistant    Social History Main  Topics  . Smoking status: Never Smoker  . Smokeless tobacco: Never Used  . Alcohol use 1.2 oz/week    2 Glasses of wine per week  . Drug use: No  . Sexual activity: Yes    Partners: Male    Birth control/ protection: Post-menopausal   Other Topics Concern  . Not on file   Social History Narrative   Hay has 6 grandchilden age 18 to 30 (4 girls and 2 boys)    Allergies  Allergen Reactions  . Sulfa Antibiotics Swelling  . Vioxx [Rofecoxib]   . Penicillins Rash  . Tape Rash    Outpatient Medications Prior to Visit  Medication Sig Dispense Refill  . albuterol-ipratropium (COMBIVENT) 18-103 MCG/ACT inhaler Inhale 1 puff into the lungs as needed. 1 Inhaler 11  . ALPRAZolam (XANAX) 0.25 MG tablet Take 1 tablet (0.25 mg total) by mouth at bedtime. 15 tablet 0  . conjugated estrogens (PREMARIN) vaginal cream Place 1 Applicatorful vaginally 2 (two) times a week. 42.5 g 11  . ibuprofen (ADVIL,MOTRIN) 200 MG tablet Take 1 tablet by mouth as needed.    . metoprolol succinate (TOPROL-XL) 50 MG 24 hr tablet TAKE 1 TABLET BY MOUTH DAILY WITH OR IMMEDIATELY FOLLOWING A MEAL 30 tablet 0  .  metoprolol tartrate (LOPRESSOR) 25 MG tablet Take 1 tablet by mouth 2 (two) times daily. cardiologist     Facility-Administered Medications Prior to Visit  Medication Dose Route Frequency Provider Last Rate Last Dose  . albuterol (PROVENTIL) (2.5 MG/3ML) 0.083% nebulizer solution 2.5 mg  2.5 mg Nebulization Once Juline Patch, MD        Review of Systems  Constitutional: Positive for fever. Negative for chills, malaise/fatigue and weight loss.  HENT: Negative for ear discharge, ear pain and sore throat.   Eyes: Negative for blurred vision.  Respiratory: Negative for cough, sputum production, shortness of breath and wheezing.   Cardiovascular: Negative for chest pain, palpitations and leg swelling.  Gastrointestinal: Positive for abdominal pain and diarrhea. Negative for blood in stool, constipation,  heartburn, hematochezia, melena and nausea.  Genitourinary: Negative for dysuria, frequency, hematuria and urgency.  Musculoskeletal: Negative for back pain, joint pain, myalgias and neck pain.  Skin: Negative for rash.  Neurological: Negative for dizziness, tingling, sensory change, focal weakness and headaches.  Endo/Heme/Allergies: Negative for environmental allergies and polydipsia. Does not bruise/bleed easily.  Psychiatric/Behavioral: Negative for depression and suicidal ideas. The patient is not nervous/anxious and does not have insomnia.      Objective  Vitals:   03/02/17 1151  BP: 120/80  Pulse: 64  Temp: 98.3 F (36.8 C)  Weight: 138 lb (62.6 kg)  Height: 5\' 1"  (1.549 m)    Physical Exam  Constitutional: She is well-developed, well-nourished, and in no distress. No distress.  HENT:  Head: Normocephalic and atraumatic.  Right Ear: External ear normal.  Left Ear: External ear normal.  Nose: Nose normal.  Mouth/Throat: Oropharynx is clear and moist.  Eyes: Pupils are equal, round, and reactive to light. Conjunctivae and EOM are normal. Right eye exhibits no discharge. Left eye exhibits no discharge.  Neck: Normal range of motion. Neck supple. No JVD present. No thyromegaly present.  Cardiovascular: Normal rate, regular rhythm, normal heart sounds and intact distal pulses.  Exam reveals no gallop and no friction rub.   No murmur heard. Pulmonary/Chest: Effort normal and breath sounds normal. She has no wheezes. She has no rales.  Abdominal: Soft. Bowel sounds are normal. She exhibits no mass. There is no hepatosplenomegaly. There is tenderness in the left lower quadrant. There is rebound. There is no rigidity, no guarding and no CVA tenderness.  Musculoskeletal: Normal range of motion. She exhibits no edema.  Lymphadenopathy:    She has no cervical adenopathy.  Neurological: She is alert. She has normal reflexes.  Skin: Skin is warm and dry. She is not diaphoretic.   Psychiatric: Mood and affect normal.  Nursing note and vitals reviewed.     Assessment & Plan  Problem List Items Addressed This Visit      Other   Diverticulitis - Primary   Relevant Medications   ciprofloxacin (CIPRO) 500 MG tablet   metroNIDAZOLE (FLAGYL) 500 MG tablet    Other Visit Diagnoses    Candidiasis       Relevant Medications   metroNIDAZOLE (FLAGYL) 500 MG tablet   fluconazole (DIFLUCAN) 150 MG tablet      Meds ordered this encounter  Medications  . ciprofloxacin (CIPRO) 500 MG tablet    Sig: Take 1 tablet (500 mg total) by mouth 2 (two) times daily.    Dispense:  20 tablet    Refill:  0  . metroNIDAZOLE (FLAGYL) 500 MG tablet    Sig: Take 1 tablet (500 mg total) by mouth  2 (two) times daily.    Dispense:  20 tablet    Refill:  0  . fluconazole (DIFLUCAN) 150 MG tablet    Sig: Take 1 tablet (150 mg total) by mouth once.    Dispense:  1 tablet    Refill:  0      Dr. Macon [facility]  03/02/17

## 2017-03-08 ENCOUNTER — Ambulatory Visit: Payer: BLUE CROSS/BLUE SHIELD | Admitting: Family Medicine

## 2017-05-25 ENCOUNTER — Other Ambulatory Visit: Payer: Self-pay

## 2017-06-11 ENCOUNTER — Other Ambulatory Visit: Payer: Self-pay

## 2017-06-11 DIAGNOSIS — J45909 Unspecified asthma, uncomplicated: Secondary | ICD-10-CM

## 2017-06-11 MED ORDER — IPRATROPIUM-ALBUTEROL 20-100 MCG/ACT IN AERS: 1.0000 | INHALATION_SPRAY | Freq: Four times a day (QID) | RESPIRATORY_TRACT | Status: AC

## 2017-06-29 ENCOUNTER — Other Ambulatory Visit: Payer: Self-pay

## 2017-06-29 ENCOUNTER — Other Ambulatory Visit: Payer: Self-pay | Admitting: Family Medicine

## 2017-06-29 ENCOUNTER — Telehealth: Payer: Self-pay

## 2017-06-29 MED ORDER — IPRATROPIUM-ALBUTEROL 20-100 MCG/ACT IN AERS: 1.0000 | INHALATION_SPRAY | Freq: Four times a day (QID) | RESPIRATORY_TRACT | 6 refills | Status: DC

## 2017-06-29 NOTE — Telephone Encounter (Signed)
Sent in combivent inhaler to [facility] [place]

## 2017-07-02 DIAGNOSIS — R002 Palpitations: Secondary | ICD-10-CM | POA: Diagnosis not present

## 2017-07-02 DIAGNOSIS — I10 Essential (primary) hypertension: Secondary | ICD-10-CM | POA: Diagnosis not present

## 2017-07-11 DIAGNOSIS — R002 Palpitations: Secondary | ICD-10-CM | POA: Diagnosis not present

## 2017-07-11 DIAGNOSIS — I10 Essential (primary) hypertension: Secondary | ICD-10-CM | POA: Diagnosis not present

## 2017-07-16 DIAGNOSIS — L820 Inflamed seborrheic keratosis: Secondary | ICD-10-CM | POA: Diagnosis not present

## 2017-07-16 DIAGNOSIS — D1801 Hemangioma of skin and subcutaneous tissue: Secondary | ICD-10-CM | POA: Diagnosis not present

## 2017-07-16 DIAGNOSIS — Z85828 Personal history of other malignant neoplasm of skin: Secondary | ICD-10-CM | POA: Diagnosis not present

## 2017-07-16 DIAGNOSIS — L570 Actinic keratosis: Secondary | ICD-10-CM | POA: Diagnosis not present

## 2017-07-16 DIAGNOSIS — L814 Other melanin hyperpigmentation: Secondary | ICD-10-CM | POA: Diagnosis not present

## 2017-07-16 DIAGNOSIS — L218 Other seborrheic dermatitis: Secondary | ICD-10-CM | POA: Diagnosis not present

## 2017-08-15 DIAGNOSIS — H10013 Acute follicular conjunctivitis, bilateral: Secondary | ICD-10-CM | POA: Diagnosis not present

## 2017-08-24 DIAGNOSIS — Z5181 Encounter for therapeutic drug level monitoring: Secondary | ICD-10-CM | POA: Diagnosis not present

## 2017-08-24 DIAGNOSIS — R002 Palpitations: Secondary | ICD-10-CM | POA: Diagnosis not present

## 2017-08-24 DIAGNOSIS — Z79899 Other long term (current) drug therapy: Secondary | ICD-10-CM | POA: Diagnosis not present

## 2017-08-24 DIAGNOSIS — I10 Essential (primary) hypertension: Secondary | ICD-10-CM | POA: Diagnosis not present

## 2017-10-23 DIAGNOSIS — R69 Illness, unspecified: Secondary | ICD-10-CM | POA: Diagnosis not present

## 2017-11-05 ENCOUNTER — Other Ambulatory Visit: Payer: Self-pay

## 2017-11-05 ENCOUNTER — Encounter: Payer: Self-pay | Admitting: General Surgery

## 2017-11-05 ENCOUNTER — Encounter: Payer: Self-pay | Admitting: Anesthesiology

## 2017-11-05 ENCOUNTER — Ambulatory Visit: Payer: Medicare HMO | Admitting: Anesthesiology

## 2017-11-05 ENCOUNTER — Observation Stay
Admission: AD | Admit: 2017-11-05 | Discharge: 2017-11-06 | Disposition: A | Discharge status: Home / Self Care | Payer: Medicare HMO | Source: Ambulatory Visit | Attending: General Surgery | Admitting: General Surgery

## 2017-11-05 ENCOUNTER — Ambulatory Visit (INDEPENDENT_AMBULATORY_CARE_PROVIDER_SITE_OTHER): Payer: Medicare HMO | Admitting: Family Medicine

## 2017-11-05 ENCOUNTER — Other Ambulatory Visit
Admission: RE | Admit: 2017-11-05 | Discharge: 2017-11-05 | Disposition: A | Discharge status: Home / Self Care | Payer: Medicare HMO | Source: Ambulatory Visit | Attending: Family Medicine | Admitting: Family Medicine

## 2017-11-05 ENCOUNTER — Ambulatory Visit: Payer: Medicare HMO | Admitting: General Surgery

## 2017-11-05 ENCOUNTER — Encounter
Admission: AD | Disposition: A | Discharge status: Home / Self Care | Payer: Self-pay | Source: Ambulatory Visit | Attending: General Surgery

## 2017-11-05 VITALS — BP 120/66 | HR 70 | Temp 98.6°F | Resp 14 | Ht 62.0 in | Wt 140.0 lb

## 2017-11-05 VITALS — BP 110/80 | HR 72 | Temp 99.1°F | Ht 61.0 in | Wt 140.0 lb

## 2017-11-05 DIAGNOSIS — K3532 Acute appendicitis with perforation and localized peritonitis, without abscess: Secondary | ICD-10-CM | POA: Diagnosis not present

## 2017-11-05 DIAGNOSIS — J45909 Unspecified asthma, uncomplicated: Secondary | ICD-10-CM | POA: Insufficient documentation

## 2017-11-05 DIAGNOSIS — I10 Essential (primary) hypertension: Secondary | ICD-10-CM | POA: Diagnosis not present

## 2017-11-05 DIAGNOSIS — Z79899 Other long term (current) drug therapy: Secondary | ICD-10-CM | POA: Diagnosis not present

## 2017-11-05 DIAGNOSIS — Z791 Long term (current) use of non-steroidal anti-inflammatories (NSAID): Secondary | ICD-10-CM | POA: Insufficient documentation

## 2017-11-05 DIAGNOSIS — R1031 Right lower quadrant pain: Secondary | ICD-10-CM | POA: Diagnosis not present

## 2017-11-05 DIAGNOSIS — K3580 Unspecified acute appendicitis: Secondary | ICD-10-CM | POA: Diagnosis present

## 2017-11-05 DIAGNOSIS — R1030 Lower abdominal pain, unspecified: Secondary | ICD-10-CM | POA: Insufficient documentation

## 2017-11-05 HISTORY — PX: LAPAROSCOPIC APPENDECTOMY: SHX408

## 2017-11-05 LAB — CBC WITH DIFFERENTIAL/PLATELET
BASOS PCT: 1 %
Basophils Absolute: 0.1 10*3/uL (ref 0–0.1)
EOS ABS: 0 10*3/uL (ref 0–0.7)
EOS PCT: 0 %
HEMATOCRIT: 40.9 % (ref 35.0–47.0)
Hemoglobin: 14 g/dL (ref 12.0–16.0)
Lymphocytes Relative: 10 %
Lymphs Abs: 1.2 10*3/uL (ref 1.0–3.6)
MCH: 32.4 pg (ref 26.0–34.0)
MCHC: 34.3 g/dL (ref 32.0–36.0)
MCV: 94.4 fL (ref 80.0–100.0)
MONO ABS: 0.8 10*3/uL (ref 0.2–0.9)
MONOS PCT: 6 %
Neutro Abs: 10.9 10*3/uL — ABNORMAL HIGH (ref 1.4–6.5)
Neutrophils Relative %: 83 %
PLATELETS: 228 10*3/uL (ref 150–440)
RBC: 4.34 MIL/uL (ref 3.80–5.20)
RDW: 12.8 % (ref 11.5–14.5)
WBC: 13 10*3/uL — AB (ref 3.6–11.0)

## 2017-11-05 SURGERY — APPENDECTOMY, LAPAROSCOPIC
Anesthesia: General | Wound class: Clean Contaminated

## 2017-11-05 MED ORDER — PROMETHAZINE HCL 25 MG/ML IJ SOLN
12.5000 mg | Freq: Once | INTRAMUSCULAR | Status: AC
  Administered 2017-11-05: 12.5 mg via INTRAVENOUS

## 2017-11-05 MED ORDER — LIDOCAINE HCL (CARDIAC) PF 100 MG/5ML IV SOSY
PREFILLED_SYRINGE | INTRAVENOUS | Status: DC | PRN
  Administered 2017-11-05: 60 mg via INTRAVENOUS

## 2017-11-05 MED ORDER — SUGAMMADEX SODIUM 200 MG/2ML IV SOLN
INTRAVENOUS | Status: DC | PRN
  Administered 2017-11-05: 130 mg via INTRAVENOUS

## 2017-11-05 MED ORDER — SUGAMMADEX SODIUM 200 MG/2ML IV SOLN
INTRAVENOUS | Status: AC
  Filled 2017-11-05: qty 2

## 2017-11-05 MED ORDER — ACETAMINOPHEN 325 MG PO TABS
650.0000 mg | ORAL_TABLET | ORAL | Status: DC | PRN
  Administered 2017-11-06: 650 mg via ORAL
  Filled 2017-11-05: qty 2

## 2017-11-05 MED ORDER — ACETAMINOPHEN 10 MG/ML IV SOLN
INTRAVENOUS | Status: AC
  Filled 2017-11-05: qty 100

## 2017-11-05 MED ORDER — RACEPINEPHRINE HCL 2.25 % IN NEBU
0.5000 mL | INHALATION_SOLUTION | Freq: Once | RESPIRATORY_TRACT | Status: AC
  Administered 2017-11-05: 0.5 mL via RESPIRATORY_TRACT

## 2017-11-05 MED ORDER — IPRATROPIUM-ALBUTEROL 0.5-2.5 (3) MG/3ML IN SOLN
3.0000 mL | Freq: Four times a day (QID) | RESPIRATORY_TRACT | Status: DC
Start: 2017-11-05 — End: 2017-11-06
  Administered 2017-11-05 – 2017-11-06 (×3): 3 mL via RESPIRATORY_TRACT
  Filled 2017-11-05 (×2): qty 3

## 2017-11-05 MED ORDER — ONDANSETRON HCL 4 MG/2ML IJ SOLN: 4.0000 mg | INTRAMUSCULAR | Status: DC | PRN

## 2017-11-05 MED ORDER — RACEPINEPHRINE HCL 2.25 % IN NEBU
INHALATION_SOLUTION | RESPIRATORY_TRACT | Status: AC
  Administered 2017-11-05: 0.5 mL via RESPIRATORY_TRACT
  Filled 2017-11-05: qty 0.5

## 2017-11-05 MED ORDER — PHENOL 1.4 % MT LIQD
1.0000 | OROMUCOSAL | Status: DC | PRN
  Administered 2017-11-06: 1 via OROMUCOSAL
  Filled 2017-11-05 (×2): qty 177

## 2017-11-05 MED ORDER — MORPHINE SULFATE (PF) 4 MG/ML IV SOLN
2.0000 mg | INTRAVENOUS | Status: DC | PRN
  Administered 2017-11-05: 2 mg via INTRAVENOUS

## 2017-11-05 MED ORDER — PROPOFOL 10 MG/ML IV BOLUS
INTRAVENOUS | Status: DC | PRN
  Administered 2017-11-05: 110 mg via INTRAVENOUS

## 2017-11-05 MED ORDER — BUPIVACAINE HCL (PF) 0.5 % IJ SOLN
INTRAMUSCULAR | Status: AC
  Filled 2017-11-05: qty 30

## 2017-11-05 MED ORDER — ACETAMINOPHEN 10 MG/ML IV SOLN
INTRAVENOUS | Status: DC | PRN
  Administered 2017-11-05: 1000 mg via INTRAVENOUS

## 2017-11-05 MED ORDER — LACTATED RINGERS IV SOLN
INTRAVENOUS | Status: DC
  Administered 2017-11-05: 15:00:00 via INTRAVENOUS

## 2017-11-05 MED ORDER — PROPOFOL 10 MG/ML IV BOLUS
INTRAVENOUS | Status: AC
  Filled 2017-11-05: qty 20

## 2017-11-05 MED ORDER — PROMETHAZINE HCL 25 MG/ML IJ SOLN: 12.5000 mg | INTRAMUSCULAR | Status: DC | PRN

## 2017-11-05 MED ORDER — FENTANYL CITRATE (PF) 100 MCG/2ML IJ SOLN: 25.0000 ug | INTRAMUSCULAR | Status: DC | PRN

## 2017-11-05 MED ORDER — LACTATED RINGERS IV SOLN: INTRAVENOUS | Status: DC

## 2017-11-05 MED ORDER — BUPIVACAINE HCL (PF) 0.5 % IJ SOLN
INTRAMUSCULAR | Status: DC | PRN
  Administered 2017-11-05: 30 mL

## 2017-11-05 MED ORDER — FENTANYL CITRATE (PF) 100 MCG/2ML IJ SOLN
INTRAMUSCULAR | Status: AC
  Filled 2017-11-05: qty 2

## 2017-11-05 MED ORDER — ROCURONIUM BROMIDE 50 MG/5ML IV SOLN
INTRAVENOUS | Status: AC
  Filled 2017-11-05: qty 1

## 2017-11-05 MED ORDER — FAMOTIDINE 20 MG PO TABS
ORAL_TABLET | ORAL | Status: AC
  Administered 2017-11-05: 20 mg via ORAL
  Filled 2017-11-05: qty 1

## 2017-11-05 MED ORDER — SODIUM CHLORIDE 0.9 % IV SOLN
1.0000 g | INTRAVENOUS | Status: DC
  Filled 2017-11-05: qty 1

## 2017-11-05 MED ORDER — PROMETHAZINE HCL 25 MG/ML IJ SOLN
INTRAMUSCULAR | Status: AC
  Administered 2017-11-05: 12.5 mg via INTRAVENOUS
  Filled 2017-11-05: qty 1

## 2017-11-05 MED ORDER — SODIUM CHLORIDE FLUSH 0.9 % IV SOLN
INTRAVENOUS | Status: AC
  Administered 2017-11-05: 15:00:00
  Filled 2017-11-05: qty 10

## 2017-11-05 MED ORDER — MORPHINE SULFATE (PF) 4 MG/ML IV SOLN
INTRAVENOUS | Status: AC
  Administered 2017-11-05: 2 mg via INTRAVENOUS
  Filled 2017-11-05: qty 1

## 2017-11-05 MED ORDER — MORPHINE SULFATE (PF) 2 MG/ML IV SOLN
2.0000 mg | INTRAVENOUS | Status: DC | PRN
  Administered 2017-11-05 – 2017-11-06 (×2): 2 mg via INTRAVENOUS
  Filled 2017-11-05 (×2): qty 1

## 2017-11-05 MED ORDER — MIDAZOLAM HCL 2 MG/2ML IJ SOLN
INTRAMUSCULAR | Status: DC | PRN
  Administered 2017-11-05: 2 mg via INTRAVENOUS

## 2017-11-05 MED ORDER — FENTANYL CITRATE (PF) 100 MCG/2ML IJ SOLN
INTRAMUSCULAR | Status: DC | PRN
  Administered 2017-11-05: 75 ug via INTRAVENOUS

## 2017-11-05 MED ORDER — ROCURONIUM BROMIDE 100 MG/10ML IV SOLN
INTRAVENOUS | Status: DC | PRN
  Administered 2017-11-05: 5 mg via INTRAVENOUS
  Administered 2017-11-05: 25 mg via INTRAVENOUS

## 2017-11-05 MED ORDER — LIDOCAINE HCL (PF) 2 % IJ SOLN
INTRAMUSCULAR | Status: AC
  Filled 2017-11-05: qty 10

## 2017-11-05 MED ORDER — SODIUM CHLORIDE 0.9 % IV SOLN
1.0000 g | INTRAVENOUS | Status: AC
  Administered 2017-11-05: 1 g via INTRAVENOUS
  Filled 2017-11-05: qty 1

## 2017-11-05 MED ORDER — COMBIVENT 18-103 MCG/ACT IN AERO: 1.0000 | INHALATION_SPRAY | RESPIRATORY_TRACT | Status: DC

## 2017-11-05 MED ORDER — FAMOTIDINE 20 MG PO TABS
20.0000 mg | ORAL_TABLET | Freq: Once | ORAL | Status: AC
  Administered 2017-11-05: 20 mg via ORAL

## 2017-11-05 MED ORDER — ONDANSETRON HCL 4 MG/2ML IJ SOLN
INTRAMUSCULAR | Status: DC | PRN
  Administered 2017-11-05: 4 mg via INTRAVENOUS

## 2017-11-05 MED ORDER — MIDAZOLAM HCL 2 MG/2ML IJ SOLN
INTRAMUSCULAR | Status: AC
  Filled 2017-11-05: qty 2

## 2017-11-05 MED ORDER — SUCCINYLCHOLINE CHLORIDE 20 MG/ML IJ SOLN
INTRAMUSCULAR | Status: DC | PRN
Start: 2017-11-05 — End: 2017-11-05
  Administered 2017-11-05: 70 mg via INTRAVENOUS

## 2017-11-05 MED ORDER — PHENYLEPHRINE HCL 10 MG/ML IJ SOLN
INTRAMUSCULAR | Status: DC | PRN
  Administered 2017-11-05 (×2): 100 ug via INTRAVENOUS
  Administered 2017-11-05: 50 ug via INTRAVENOUS

## 2017-11-05 MED ORDER — ONDANSETRON HCL 4 MG/2ML IJ SOLN: 4.0000 mg | Freq: Once | INTRAMUSCULAR | Status: DC | PRN

## 2017-11-05 MED ORDER — DEXAMETHASONE SODIUM PHOSPHATE 10 MG/ML IJ SOLN
INTRAMUSCULAR | Status: DC | PRN
  Administered 2017-11-05: 8 mg via INTRAVENOUS

## 2017-11-05 SURGICAL SUPPLY — 35 items
BLADE SURG 11 STRL SS SAFETY (MISCELLANEOUS) ×2 IMPLANT
CANISTER SUCT 1200ML W/VALVE (MISCELLANEOUS) ×2 IMPLANT
CANNULA DILATOR 10 W/SLV (CANNULA) ×4 IMPLANT
CHLORAPREP W/TINT 26ML (MISCELLANEOUS) ×2 IMPLANT
CUTTER FLEX LINEAR 45M (STAPLE) ×2 IMPLANT
DRSG TEGADERM 2-3/8X2-3/4 SM (GAUZE/BANDAGES/DRESSINGS) ×6 IMPLANT
DRSG TELFA 4X3 1S NADH ST (GAUZE/BANDAGES/DRESSINGS) ×2 IMPLANT
ELECT REM PT RETURN 9FT ADLT (ELECTROSURGICAL) ×2
ELECTRODE REM PT RTRN 9FT ADLT (ELECTROSURGICAL) ×1 IMPLANT
GLOVE BIO SURGEON STRL SZ7.5 (GLOVE) ×4 IMPLANT
GLOVE INDICATOR 8.0 STRL GRN (GLOVE) ×4 IMPLANT
GOWN STRL REUS W/ TWL LRG LVL3 (GOWN DISPOSABLE) ×2 IMPLANT
GOWN STRL REUS W/TWL LRG LVL3 (GOWN DISPOSABLE) ×2
GRASPER SUT TROCAR 14GX15 (MISCELLANEOUS) ×2 IMPLANT
IRRIGATION STRYKERFLOW (MISCELLANEOUS) ×1 IMPLANT
IRRIGATOR STRYKERFLOW (MISCELLANEOUS) ×2
IV LACTATED RINGERS 1000ML (IV SOLUTION) IMPLANT
KIT TURNOVER KIT A (KITS) ×2 IMPLANT
LABEL OR SOLS (LABEL) ×2 IMPLANT
NDL INSUFF ACCESS 14 VERSASTEP (NEEDLE) ×2 IMPLANT
NEEDLE HYPO 22GX1.5 SAFETY (NEEDLE) ×2 IMPLANT
NS IRRIG 1000ML POUR BTL (IV SOLUTION) ×2 IMPLANT
PACK LAP CHOLECYSTECTOMY (MISCELLANEOUS) ×2 IMPLANT
POUCH ENDO CATCH 10MM SPEC (MISCELLANEOUS) ×2 IMPLANT
RELOAD 45 VASCULAR/THIN (ENDOMECHANICALS) ×2 IMPLANT
RELOAD STAPLE TA45 3.5 REG BLU (ENDOMECHANICALS) ×2 IMPLANT
SEAL FOR SCOPE WARMER C3101 (MISCELLANEOUS) IMPLANT
STRIP CLOSURE SKIN 1/2X4 (GAUZE/BANDAGES/DRESSINGS) ×2 IMPLANT
SUT VIC AB 0 CT2 27 (SUTURE) ×2 IMPLANT
SUT VIC AB 4-0 FS2 27 (SUTURE) ×2 IMPLANT
SWABSTK COMLB BENZOIN TINCTURE (MISCELLANEOUS) ×2 IMPLANT
SYR 10ML LL (SYRINGE) IMPLANT
TRAY FOLEY MTR SLVR 16FR STAT (SET/KITS/TRAYS/PACK) IMPLANT
TROCAR XCEL 12X100 BLDLESS (ENDOMECHANICALS) ×2 IMPLANT
TUBING INSUFFLATION (TUBING) ×2 IMPLANT

## 2017-11-05 NOTE — Patient Instructions (Addendum)
Appendicitis °The appendix is a tube that is shaped like a finger. It is connected to the large intestine. Appendicitis means that this tube is swollen (inflamed). Without treatment, the tube can tear (rupture). This can lead to a life-threatening infection. It can also cause you to have sores (abscesses). These sores hurt. °What are the causes? °This condition may be caused by something that blocks the appendix, such as: °· A ball of poop (stool). °· Lymph glands that are bigger than normal. ° °Sometimes, the cause is not known. °What are the signs or symptoms? °Symptoms of this condition include: °· Pain around the belly button (navel). °? The pain moves toward the lower right belly (abdomen). °? The pain can get worse with time. °? The pain can get worse if you cough. °? The pain can get worse if you move suddenly. °· Tenderness in the lower right belly. °· Feeling sick to your stomach (nauseous). °· Throwing up (vomiting). °· Not feeling hungry (loss of appetite). °· A fever. °· Having a hard time pooping (constipation). °· Watery poop (diarrhea). °· Not feeling well. ° °How is this treated? °Usually, this condition is treated by taking out the appendix (appendectomy). There are two ways that the appendix can be taken out: °· Open surgery. In this surgery, the appendix is taken out through a large cut (incision). The cut is made in the lower right belly. This surgery may be picked if: °? You have scars from another surgery. °? You have a bleeding condition. °? You are pregnant and will be having your baby soon. °? You have a condition that does not allow the other type of surgery. °· Laparoscopic surgery. In this surgery, the appendix is taken out through small cuts. Often, this surgery: °? Causes less pain. °? Causes fewer problems. °? Is easier to heal from. ° °If your appendix tears and a sore forms: °· A drain may be put into the sore. The drain will be used to get rid of fluid. °· You may get an antibiotic  medicine through an IV tube. °· Your appendix may or may not need to be taken out. ° °This information is not intended to replace advice given to you by your health care provider. Make sure you discuss any questions you have with your health care provider. °Document Released: 09/04/2011 Document Revised: 11/18/2015 Document Reviewed: 10/28/2014 °Elsevier Interactive Patient Education © 2018 Else[employer] Inc[employer] ° °

## 2017-11-05 NOTE — Op Note (Signed)
Preoperative diagnosis: Acute appendicitis.  Postoperative diagnosis: Same.  Operative procedure: Laparoscopic appendectomy.  Operating Surgeon: Hervey Ard, MD.  Anesthesia: General endotracheal, Marcaine 0.5%, plain, 20 cc.  Estimated blood loss: 5 cc.  Clinical note: This 67 year old woman was well until last evening approximately 7 PM when she noted periumbilical pain that over the next several hours moved to the right lower quadrant.  This is been persistent and associated with nausea and vomiting.  Clinical exam was consistent with acute appendicitis.  She is brought to the operating room having received Invanz intravenously for antibiotic prophylaxis.  Operative note: With the patient under adequate general endotracheal anesthesia the abdomen was cleansed with ChloraPrep and draped.  10 cc of Marcaine was infiltrated at the umbilicus and a Veress needle passed without incident. Pneumoperitoneum was established at 10 mm HG. A 10 mm Step port was expanded without incident.   The appendix was noted to be markedly inflamed.  A 12 mm Xcel port was placed outside the rectus fascia on the left and a 10 mm Step port placed in the hypogastrium.   The appendix was divided from the cecum with a single application of a blue endo GIA load.  The mesoappendix was divided with a white, vascular endo GIA load. Good hemostasis was noted.   The appendix was placed in an endocatch bag and extracted thru the 12 mm port site.  The right lower quadrant was irrigated and good hemostasis noted.  The 12 mm port site ws closed with a 0 Vicryl suture using the "cone" device and the PMI suture passer.  Skin incisions were closed with 4-0 Vicryl subcuticular sutures. Benzoin, steri-strips, telfa and tegaderm applied.  The patient tolerated the procedure well and was taken to the PACU in good condition.

## 2017-11-05 NOTE — Progress Notes (Signed)
Belching  Feeling better

## 2017-11-05 NOTE — Progress Notes (Signed)
Pt states she feels like she needs breathing treatment and upper throat tightness  Uvula swollen   Racepinephrine given

## 2017-11-05 NOTE — Progress Notes (Signed)
Throat tightness better

## 2017-11-05 NOTE — Patient Instructions (Signed)
Appendicitis °The appendix is a finger-shaped tube that is attached to the large intestine. Appendicitis is inflammation of the appendix. Without treatment, appendicitis can cause the appendix to tear (rupture). A ruptured appendix can lead to a life-threatening infection. It can also lead to the formation of a painful collection of pus (abscess) in the appendix. °What are the causes? °This condition may be caused by a blockage in the appendix that leads to infection. The blockage can be due to: °· A ball of stool. °· Enlarged lymph glands. ° °In some cases, the cause may not be known. °What increases the risk? °This condition is more likely to develop in people who are 10-30 years of age. °What are the signs or symptoms? °Symptoms of this condition include: °· Pain around the belly button that moves toward the lower right abdomen. The pain can become more severe as time passes. It gets worse with coughing or sudden movements. °· Tenderness in the lower right abdomen. °· Nausea. °· Vomiting. °· Loss of appetite. °· Fever. °· Constipation. °· Diarrhea. °· Generally not feeling well. ° °How is this diagnosed? °This condition may be diagnosed with: °· A physical exam. °· Blood tests. °· Urine test. ° °To confirm the diagnosis, an ultrasound, MRI, or CT scan may be done. °How is this treated? °This condition is usually treated by taking out the appendix (appendectomy). There are two methods for doing an appendectomy: °· Open appendectomy. In this surgery, the appendix is removed through a large cut (incision) that is made in the lower right abdomen. This procedure may be recommended if: °? You have major scarring from a previous surgery. °? You have a bleeding disorder. °? You are pregnant and are near term. °? You have a condition that makes the laparoscopic procedure impossible, such as an advanced infection or a ruptured appendix. °· Laparoscopic appendectomy. In this surgery, the appendix is removed through small  incisions. This procedure usually causes less pain and fewer problems than an open appendectomy. It also has a shorter recovery time. ° °If the appendix has ruptured and an abscess has formed, a drain may be placed into the abscess to remove fluid and antibiotic medicines may be given through an IV tube. The appendix may or may not need to be removed. °This information is not intended to replace advice given to you by your health care provider. Make sure you discuss any questions you have with your health care provider. °Document Released: 06/12/2005 Document Revised: 10/20/2015 Document Reviewed: 10/28/2014 °Elsevier Interactive Patient Education © 2017 Elsevier Inc. ° °

## 2017-11-05 NOTE — Progress Notes (Signed)
Patient ID: Leslie Silva, female   DOB: 1950-09-15, 67 y.o.   MRN: [id number]  Chief Complaint  Patient presents with  . Other    HPI Leslie Silva is a 67 y.o. female here today for a evaluation of appendicitis.The pain is in her right lower quadrant and is sharp stabbing pain. Patient started last night she had nausae, vomiting and fever last night around 7:00 PM. Moves her bowels. Husband, Leslie Silva is present.    HPI  Past Medical History:  Diagnosis Date  . Arthritis   . Asthma   . Breast mass   . Depression   . Hypertension   . Mitral valve prolapse     Past Surgical History:  Procedure Laterality Date  . BACK SURGERY  2011  . BASAL CELL CARCINOMA EXCISION  12/2013  . BREAST CYST ASPIRATION Left   . CATARACT EXTRACTION Bilateral   . COLONOSCOPY  2016   cleared for 10 yrs- divert- Dr Eliberto Ivory [facility]  . FOOT NEUROMA SURGERY Left 2006    Family History  Problem Relation Age of Onset  . Cancer Mother   . Liver cancer Mother 87  . Heart disease Father   . Asthma Brother   . Liver cancer Brother 2  . Heart disease Maternal Aunt        aortic valve replacement  . Heart disease Cousin        aortic valve replacement    Social History Social History   Tobacco Use  . Smoking status: Never Smoker  . Smokeless tobacco: Never Used  Substance Use Topics  . Alcohol use: Yes    Alcohol/week: 1.2 oz    Types: 2 Glasses of wine per week  . Drug use: No    Allergies  Allergen Reactions  . Ciprofloxacin Anaphylaxis    "Mouth swelling up"  . Sulfa Antibiotics Swelling  . Vioxx [Rofecoxib]   . Penicillins Rash  . Tape Rash    Current Outpatient Medications  Medication Sig Dispense Refill  . albuterol-ipratropium (COMBIVENT) 18-103 MCG/ACT inhaler Inhale 1 puff into the lungs as needed. 1 Inhaler 11  . conjugated estrogens (PREMARIN) vaginal cream Place 1 Applicatorful vaginally 2 (two) times a week. 42.5 g 11  . ibuprofen (ADVIL,MOTRIN) 200 MG tablet Take 1 tablet  by mouth as needed.    . Ipratropium-Albuterol (COMBIVENT RESPIMAT) 20-100 MCG/ACT AERS respimat Inhale 1 puff into the lungs every 6 (six) hours. 1 Inhaler 6  . metoprolol succinate (TOPROL-XL) 50 MG 24 hr tablet TAKE 1 TABLET BY MOUTH DAILY WITH OR IMMEDIATELY FOLLOWING A MEAL 30 tablet 0  . metoprolol tartrate (LOPRESSOR) 25 MG tablet Take 1 tablet by mouth 2 (two) times daily. cardiologist     Current Facility-Administered Medications  Medication Dose Route Frequency Provider Last Rate Last Dose  . albuterol (PROVENTIL) (2.5 MG/3ML) 0.083% nebulizer solution 2.5 mg  2.5 mg Nebulization Once Jones, Iven Finn, MD      . Ipratropium-Albuterol (COMBIVENT) respimat 1 puff  1 puff Inhalation Q6H Juline Patch, MD        Review of Systems Review of Systems  Constitutional: Negative.   Respiratory: Negative.   Cardiovascular: Negative.     Blood pressure 120/66, pulse 70, temperature 98.6 F (37 C), resp. rate 14, height 5\' 2"  (1.575 m), weight 140 lb (63.5 kg).  Physical Exam Physical Exam  Constitutional: She is oriented to person, place, and time. She appears well-developed and well-nourished.  Cardiovascular: Normal rate, regular rhythm and normal  heart sounds.  Pulmonary/Chest: Effort normal and breath sounds normal.  Abdominal: Bowel sounds are normal. There is tenderness in the right lower quadrant.    Neurological: She is alert and oriented to person, place, and time.  Skin: Skin is warm and dry.    Data Reviewed CBC obtained earlier today showed a stable hemoglobin of 14.0, white blood cell count elevated at 13,000 with a left shift, normal platelet count of 220,000. Discussion with PCP earlier this morning.  Assessment    Acute appendicitis.    Plan   Indication for operative intervention discussed.  Possibility of observation with IV antibiotics reviewed.  Based on her clinical appearance exquisite tenderness with referred pain and leukocytosis I recommended  surgical treatment.  HPI, Physical Exam, Assessment and Plan have been scribed under the direction and in the presence of Hervey Ard, MD.  Gaspar Cola, CMA   I have completed the exam and reviewed the above documentation for accuracy and completeness.  I agree with the above.  Haematologist has been used and any errors in dictation or transcription are unintentional.  Hervey Ard, M.D., F.A.C.S.  Forest Gleason Anacleto Batterman 11/05/2017, 1:26 PM

## 2017-11-05 NOTE — Anesthesia Preprocedure Evaluation (Signed)
Anesthesia Evaluation  Patient identified by MRN, date of birth, ID band Patient awake    Reviewed: Allergy & Precautions, NPO status , Patient's Chart, lab work & pertinent test results, reviewed documented beta blocker date and time   Airway Mallampati: II  TM Distance: >3 FB     Dental  (+) Chipped   Pulmonary asthma ,           Cardiovascular hypertension, Pt. on medications and Pt. on home beta blockers + dysrhythmias      Neuro/Psych PSYCHIATRIC DISORDERS Depression    GI/Hepatic GERD  Controlled,  Endo/Other    Renal/GU      Musculoskeletal  (+) Arthritis ,   Abdominal   Peds  Hematology   Anesthesia Other Findings   Reproductive/Obstetrics                             Anesthesia Physical Anesthesia Plan  ASA: II  Anesthesia Plan: General   Post-op Pain Management:    Induction: Intravenous  PONV Risk Score and Plan:   Airway Management Planned: Oral ETT  Additional Equipment:   Intra-op Plan:   Post-operative Plan:   Informed Consent: I have reviewed the patients History and Physical, chart, labs and discussed the procedure including the risks, benefits and alternatives for the proposed anesthesia with the patient or authorized representative who has indicated his/her understanding and acceptance.     Plan Discussed with: CRNA  Anesthesia Plan Comments:         Anesthesia Quick Evaluation

## 2017-11-05 NOTE — Progress Notes (Signed)
Name: Leslie Silva   MRN: [id number]    DOB: Sep 09, 1950   Date:11/05/2017       Progress Note  Subjective  Chief Complaint  Chief Complaint  Patient presents with  . Abdominal Pain    nausea, vomitting, pain starts in the center and radates to the RUQ- has some diarrhea as well as chills. Hx of diverticulitis    Abdominal Pain  This is a new problem. The current episode started yesterday. The onset quality is sudden. The problem occurs daily. The problem has been gradually worsening. The pain is located in the RLQ. The pain is at a severity of 4/10 (move7/10  palp 8/10). The pain is moderate. The quality of the pain is sharp. The abdominal pain does not radiate. Associated symptoms include a fever, flatus, nausea and vomiting. Pertinent negatives include no constipation, diarrhea, dysuria, frequency, headaches, hematochezia, hematuria, melena, myalgias or weight loss. Associated symptoms comments: BM soft. The pain is aggravated by movement. The pain is relieved by being still (bending knees). She has tried nothing for the symptoms. The treatment provided moderate relief.    No problem-specific Assessment & Plan notes found for this encounter.   Past Medical History:  Diagnosis Date  . Arthritis   . Asthma   . Breast mass   . Depression   . Hypertension   . Mitral valve prolapse     Past Surgical History:  Procedure Laterality Date  . BACK SURGERY  2011  . BASAL CELL CARCINOMA EXCISION  12/2013  . BREAST CYST ASPIRATION Left   . CATARACT EXTRACTION Bilateral   . COLONOSCOPY  2016   cleared for 10 yrs- divert- Dr Eliberto Ivory [facility]  . FOOT NEUROMA SURGERY Left 2006    Family History  Problem Relation Age of Onset  . Cancer Mother   . Liver cancer Mother 34  . Heart disease Father   . Asthma Brother   . Liver cancer Brother 59  . Heart disease Maternal Aunt        aortic valve replacement  . Heart disease Cousin        aortic valve replacement    Social History    Socioeconomic History  . Marital status: Married    Spouse name: Not on file  . Number of children: 2  . Years of education: Not on file  . Highest education level: Not on file  Occupational History  . Occupation: Chiropractor  Social Needs  . Financial resource strain: Not on file  . Food insecurity:    Worry: Not on file    Inability: Not on file  . Transportation needs:    Medical: Not on file    Non-medical: Not on file  Tobacco Use  . Smoking status: Never Smoker  . Smokeless tobacco: Never Used  Substance and Sexual Activity  . Alcohol use: Yes    Alcohol/week: 1.2 oz    Types: 2 Glasses of wine per week  . Drug use: No  . Sexual activity: Yes    Partners: Male    Birth control/protection: Post-menopausal  Lifestyle  . Physical activity:    Days per week: Not on file    Minutes per session: Not on file  . Stress: Not on file  Relationships  . Social connections:    Talks on phone: Not on file    Gets together: Not on file    Attends religious service: Not on file    Active member of club or organization: Not  on file    Attends meetings of clubs or organizations: Not on file    Relationship status: Not on file  . Intimate partner violence:    Fear of current or ex partner: Not on file    Emotionally abused: Not on file    Physically abused: Not on file    Forced sexual activity: Not on file  Other Topics Concern  . Not on file  Social History Narrative   Standard has 6 grandchilden age 53 to 102 (4 girls and 2 boys)    Allergies  Allergen Reactions  . Ciprofloxacin Anaphylaxis    "Mouth swelling up"  . Sulfa Antibiotics Swelling  . Vioxx [Rofecoxib]   . Penicillins Rash  . Tape Rash    Outpatient Medications Prior to Visit  Medication Sig Dispense Refill  . albuterol-ipratropium (COMBIVENT) 18-103 MCG/ACT inhaler Inhale 1 puff into the lungs as needed. 1 Inhaler 11  . ALPRAZolam (XANAX) 0.25 MG tablet Take 1 tablet (0.25 mg total) by mouth at  bedtime. 15 tablet 0  . conjugated estrogens (PREMARIN) vaginal cream Place 1 Applicatorful vaginally 2 (two) times a week. 42.5 g 11  . ibuprofen (ADVIL,MOTRIN) 200 MG tablet Take 1 tablet by mouth as needed.    . Ipratropium-Albuterol (COMBIVENT RESPIMAT) 20-100 MCG/ACT AERS respimat Inhale 1 puff into the lungs every 6 (six) hours. 1 Inhaler 6  . metoprolol succinate (TOPROL-XL) 50 MG 24 hr tablet TAKE 1 TABLET BY MOUTH DAILY WITH OR IMMEDIATELY FOLLOWING A MEAL 30 tablet 0  . metoprolol tartrate (LOPRESSOR) 25 MG tablet Take 1 tablet by mouth 2 (two) times daily. cardiologist    . ciprofloxacin (CIPRO) 500 MG tablet Take 1 tablet (500 mg total) by mouth 2 (two) times daily. 20 tablet 0  . metroNIDAZOLE (FLAGYL) 500 MG tablet Take 1 tablet (500 mg total) by mouth 2 (two) times daily. 20 tablet 0   Facility-Administered Medications Prior to Visit  Medication Dose Route Frequency Provider Last Rate Last Dose  . albuterol (PROVENTIL) (2.5 MG/3ML) 0.083% nebulizer solution 2.5 mg  2.5 mg Nebulization Once Treonna Klee, Iven Finn, MD      . Ipratropium-Albuterol (COMBIVENT) respimat 1 puff  1 puff Inhalation Q6H Juline Patch, MD        Review of Systems  Constitutional: Positive for fever. Negative for chills, malaise/fatigue and weight loss.  HENT: Negative for ear discharge, ear pain and sore throat.   Eyes: Negative for blurred vision.  Respiratory: Negative for cough, sputum production, shortness of breath and wheezing.   Cardiovascular: Negative for chest pain, palpitations and leg swelling.  Gastrointestinal: Positive for abdominal pain, flatus, nausea and vomiting. Negative for blood in stool, constipation, diarrhea, heartburn, hematochezia and melena.  Genitourinary: Negative for dysuria, frequency, hematuria and urgency.  Musculoskeletal: Negative for back pain, joint pain, myalgias and neck pain.  Skin: Negative for rash.  Neurological: Negative for dizziness, tingling, sensory change,  focal weakness and headaches.  Endo/Heme/Allergies: Negative for environmental allergies and polydipsia. Does not bruise/bleed easily.  Psychiatric/Behavioral: Negative for depression and suicidal ideas. The patient is not nervous/anxious and does not have insomnia.      Objective  Vitals:   11/05/17 1013  BP: 110/80  Pulse: 72  Temp: 99.1 F (37.3 C)  TempSrc: Oral  Weight: 140 lb (63.5 kg)  Height: 5\' 1"  (1.549 m)    Physical Exam  Constitutional: No distress.  HENT:  Head: Normocephalic and atraumatic.  Right Ear: External ear normal.  Left Ear: External  ear normal.  Nose: Nose normal.  Mouth/Throat: Oropharynx is clear and moist.  Eyes: Pupils are equal, round, and reactive to light. Conjunctivae and EOM are normal. Right eye exhibits no discharge. Left eye exhibits no discharge.  Neck: Normal range of motion. Neck supple. No JVD present. No thyromegaly present.  Cardiovascular: Normal rate, regular rhythm, normal heart sounds and intact distal pulses. Exam reveals no gallop and no friction rub.  No murmur heard. Pulmonary/Chest: Effort normal and breath sounds normal.  Abdominal: Soft. Bowel sounds are normal. She exhibits no mass. There is no hepatosplenomegaly. There is tenderness in the right lower quadrant. There is rebound, guarding and tenderness at McBurney's point. There is no rigidity and no CVA tenderness.  Musculoskeletal: Normal range of motion. She exhibits no edema.  Lymphadenopathy:    She has no cervical adenopathy.  Neurological: She is alert. She has normal reflexes.  Skin: Skin is warm and dry. She is not diaphoretic.  Nursing note and vitals reviewed.     Assessment & Plan  Problem List Items Addressed This Visit    None    Visit Diagnoses    Right lower quadrant abdominal pain    -  Primary   cbc   Relevant Orders   CBC with Differential/Platelet      No orders of the defined types were placed in this encounter.  Stat cbc and go to  Dr Bary Castilla for further eval   Dr. [facility]  11/05/17

## 2017-11-05 NOTE — Anesthesia Postprocedure Evaluation (Signed)
Anesthesia Post Note  Patient: Leslie Silva  Procedure(s) Performed: APPENDECTOMY LAPAROSCOPIC (N/A )  Anesthesia Type: General     Last Vitals:  Vitals:   11/05/17 1607 11/05/17 1617  BP: 132/69 119/62  Pulse: 82 80  Resp: 15 18  Temp:    SpO2: 96% 94%    Last Pain:  Vitals:   11/05/17 1617  TempSrc:   PainSc: 0-No pain                 Molli Barrows

## 2017-11-05 NOTE — Anesthesia Post-op Follow-up Note (Signed)
Anesthesia QCDR form completed.        

## 2017-11-05 NOTE — Anesthesia Procedure Notes (Signed)

## 2017-11-05 NOTE — Transfer of Care (Signed)
Immediate Anesthesia Transfer of Care Note  Patient: Leslie Silva  Procedure(s) Performed: APPENDECTOMY LAPAROSCOPIC (N/A )  Patient Location: PACU  Anesthesia Type:General  Level of Consciousness: sedated and responds to stimulation  Airway & Oxygen Therapy: Patient Spontanous Breathing and Patient connected to face mask oxygen  Post-op Assessment: Report given to RN and Post -op Vital signs reviewed and stable  Post vital signs: Reviewed and stable  Last Vitals:  Vitals Value Taken Time  BP 149/65 11/05/2017  3:47 PM  Temp    Pulse 81 11/05/2017  3:47 PM  Resp 21 11/05/2017  3:47 PM  SpO2 99 % 11/05/2017  3:47 PM  Vitals shown include unvalidated device data.  Last Pain:  Vitals:   11/05/17 1415  TempSrc: Temporal  PainSc: 4          Complications: No apparent anesthesia complications

## 2017-11-06 ENCOUNTER — Encounter: Payer: Self-pay | Admitting: General Surgery

## 2017-11-06 ENCOUNTER — Ambulatory Visit: Payer: Medicare HMO | Admitting: General Surgery

## 2017-11-06 DIAGNOSIS — K3532 Acute appendicitis with perforation and localized peritonitis, without abscess: Secondary | ICD-10-CM | POA: Diagnosis not present

## 2017-11-06 LAB — CBC
HEMATOCRIT: 33.7 % — AB (ref 35.0–47.0)
HEMOGLOBIN: 12.1 g/dL (ref 12.0–16.0)
MCH: 34 pg (ref 26.0–34.0)
MCHC: 35.9 g/dL (ref 32.0–36.0)
MCV: 94.6 fL (ref 80.0–100.0)
Platelets: 191 10*3/uL (ref 150–440)
RBC: 3.56 MIL/uL — ABNORMAL LOW (ref 3.80–5.20)
RDW: 13.2 % (ref 11.5–14.5)
WBC: 10.3 10*3/uL (ref 3.6–11.0)

## 2017-11-06 LAB — BASIC METABOLIC PANEL
ANION GAP: 8 (ref 5–15)
BUN: 12 mg/dL (ref 6–20)
CHLORIDE: 104 mmol/L (ref 101–111)
CO2: 25 mmol/L (ref 22–32)
CREATININE: 0.42 mg/dL — AB (ref 0.44–1.00)
Calcium: 8.5 mg/dL — ABNORMAL LOW (ref 8.9–10.3)
GFR calc Af Amer: >60 mL/min (ref >60)
GFR calc non Af Amer: >60 mL/min (ref >60)
GLUCOSE: 136 mg/dL — AB (ref 65–99)
Potassium: 3.6 mmol/L (ref 3.5–5.1)
Sodium: 137 mmol/L (ref 135–145)

## 2017-11-06 MED ORDER — IPRATROPIUM-ALBUTEROL 0.5-2.5 (3) MG/3ML IN SOLN: 3.0000 mL | Freq: Four times a day (QID) | RESPIRATORY_TRACT | Status: DC | PRN

## 2017-11-06 NOTE — Progress Notes (Signed)
Discharge teaching given to patient, patient verbalized understanding and had no questions. Patient IV removed. Patient will be transported home by family. All patient belongings gathered prior to leaving.  

## 2017-11-06 NOTE — Final Progress Note (Signed)
Tmax 99, VSS. Voiding well. Shoulder pain resolved w/ ambulation. Cough/ phlegm resolved w/ SVN. No further nausea. Lungs: Clear. Cardio: RR. ABD: Soft, non-tender, good BS. Extrem: Soft. IMP: Doing well. Plan: D/C home.

## 2017-11-06 NOTE — Care Management Obs Status (Signed)
[place] NOTIFICATION   Patient Details  Name: Leslie Silva MRN: [id number] Date of Birth: [birth year]   Medicare Observation Status Notification Given:  No(discharged less than 24 hours)    Marshell Garfinkel, RN 11/06/2017, 10:13 AM

## 2017-11-07 ENCOUNTER — Telehealth: Payer: Self-pay

## 2017-11-07 LAB — SURGICAL PATHOLOGY

## 2017-11-07 NOTE — Telephone Encounter (Signed)
Transition Care Management Follow-Up Telephone Call   Date discharged and where: 11/06/17 from [facility]  How have you been since you were released from the hospital?  States she is "feeling much better". Pain controlled with use of Tylenol and Motrin. Denies any s/sx infection at incision site.  States she had a difficult time coughing and deep breathing once she got home. Further states she believed it was related to her pain and fear that her incision would reopen. Confirmed she was provided with an incentive spirometer and has been utilizing it several times throughout the day. Since beginning the use of her incentive spirometer, pt has been coughing up phlegm. Denies any dyspnea, fever or chest pain. Recommended to continue use of incentive spirometer and to increase activity as tolerated. Education provided re: s/sx of PE/DVT formation and prevention. Verbalized acceptance and understanding of education provided.  Any patient concerns? None  Items Reviewed: Verified = V   Meds: V  Allergies: V  Dietary Orders: Heart Healthy diet. Verbalized acceptance and understanding about dietary limitations and restrictions.  Functional Questionnaire:  Independent = I Dependent = D  ADLs: I   Dressing- I   Eating- I  Maintaining continence- I  Transferring- I  Transportation- I  Meal Prep- I  Managing Meds- I  Additional Items Reviewed:  Hospital f/u appt confirmed? Yes. Scheduled to see Dr. Ronnald Ramp on 11/14/17 @ 9:30am. Denies need for transportation arrangements.  Pt denied having an appt scheduled for f/u with Dr. Bary Castilla. Advised pt to call today or tomorrow to schedule an appt for postop f/u. Verbalized acceptance and understanding.  If their condition worsens, is the pt aware to call PCP or go to the Emergency Dept.? Yes. Verbalized acceptance and understanding  Was the patient provided with contact information for the PCP's office or ED? Yes  Was to pt encouraged to call back  with questions or concerns? Yes

## 2017-11-13 ENCOUNTER — Ambulatory Visit
Admission: RE | Admit: 2017-11-13 | Discharge: 2017-11-13 | Disposition: A | Discharge status: Home / Self Care | Payer: Medicare HMO | Source: Ambulatory Visit | Attending: Family Medicine | Admitting: Family Medicine

## 2017-11-13 ENCOUNTER — Encounter: Payer: Self-pay | Admitting: General Surgery

## 2017-11-13 ENCOUNTER — Ambulatory Visit (INDEPENDENT_AMBULATORY_CARE_PROVIDER_SITE_OTHER): Payer: Medicare HMO | Admitting: Family Medicine

## 2017-11-13 ENCOUNTER — Ambulatory Visit (INDEPENDENT_AMBULATORY_CARE_PROVIDER_SITE_OTHER): Payer: Medicare HMO | Admitting: General Surgery

## 2017-11-13 ENCOUNTER — Encounter: Payer: Self-pay | Admitting: Family Medicine

## 2017-11-13 VITALS — BP 120/70 | HR 80 | Temp 98.1°F | Resp 14 | Ht 62.0 in | Wt 141.0 lb

## 2017-11-13 VITALS — BP 130/74 | HR 94 | Resp 14 | Ht 64.0 in | Wt 141.0 lb

## 2017-11-13 DIAGNOSIS — R0602 Shortness of breath: Secondary | ICD-10-CM | POA: Diagnosis not present

## 2017-11-13 DIAGNOSIS — R05 Cough: Secondary | ICD-10-CM | POA: Diagnosis not present

## 2017-11-13 DIAGNOSIS — J0100 Acute maxillary sinusitis, unspecified: Secondary | ICD-10-CM

## 2017-11-13 DIAGNOSIS — R059 Cough, unspecified: Secondary | ICD-10-CM

## 2017-11-13 DIAGNOSIS — J4521 Mild intermittent asthma with (acute) exacerbation: Secondary | ICD-10-CM

## 2017-11-13 DIAGNOSIS — R69 Illness, unspecified: Secondary | ICD-10-CM | POA: Diagnosis not present

## 2017-11-13 DIAGNOSIS — K3532 Acute appendicitis with perforation and localized peritonitis, without abscess: Secondary | ICD-10-CM

## 2017-11-13 MED ORDER — ALBUTEROL SULFATE (2.5 MG/3ML) 0.083% IN NEBU: 2.5000 mg | INHALATION_SOLUTION | Freq: Once | RESPIRATORY_TRACT | Status: AC

## 2017-11-13 MED ORDER — AZITHROMYCIN 250 MG PO TABS: ORAL_TABLET | ORAL | 0 refills | Status: DC

## 2017-11-13 MED ORDER — ALBUTEROL SULFATE (2.5 MG/3ML) 0.083% IN NEBU: 2.5000 mg | INHALATION_SOLUTION | Freq: Four times a day (QID) | RESPIRATORY_TRACT | 12 refills | Status: DC | PRN

## 2017-11-13 NOTE — Progress Notes (Signed)
Name: Leslie Silva   MRN: [id number]    DOB: [birth year]   Date:11/14/2017       Progress Note  Subjective  Chief Complaint  Chief Complaint  Patient presents with  . Sore Throat    cough, chest cong- started when they had to put the tube in. Today congestion seems "looser" but nothing is coming up    Patient was sent by surgery to evaluate expiratory breath sounds and increased wheezing necessitating increased beta agonist usage. Patient has a history of asthma requiring rescue  inhaler intervention in the past. No significant dyspnea,chest pain, or palpatations noted by patient.  Sore Throat   This is a new problem. The current episode started in the past 7 days. The problem has been unchanged. There has been no fever. The patient is experiencing no pain. Associated symptoms include abdominal pain, congestion and coughing. Pertinent negatives include no diarrhea, drooling, ear discharge, ear pain, headaches, hoarse voice, plugged ear sensation, neck pain, shortness of breath, stridor, swollen glands, trouble swallowing or vomiting. She has had no exposure to strep or mono. She has tried nothing for the symptoms.  Cough  This is a new problem. The current episode started in the past 7 days. The problem has been unchanged. The cough is productive of purulent sputum. Pertinent negatives include no chest pain, chills, ear congestion, ear pain, fever, headaches, heartburn, hemoptysis, myalgias, nasal congestion, postnasal drip, rash, rhinorrhea, sore throat, shortness of breath, sweats, weight loss or wheezing. She has tried a beta-agonist inhaler for the symptoms. Her past medical history is significant for asthma. There is no history of bronchiectasis, bronchitis, COPD, emphysema, environmental allergies or pneumonia.  Asthma  She complains of cough. There is no hemoptysis, hoarse voice, shortness of breath, sputum production or wheezing. Primary symptoms comments: S/p surgery. This is a chronic  problem. The current episode started more than 1 year ago. The problem occurs intermittently. The problem has been waxing and waning. The cough is non-productive. Pertinent negatives include no chest pain, dyspnea on exertion, ear congestion, ear pain, fever, headaches, heartburn, malaise/fatigue, myalgias, nasal congestion, orthopnea, PND, postnasal drip, rhinorrhea, sore throat, sweats, trouble swallowing or weight loss. Her past medical history is significant for asthma. There is no history of bronchiectasis, bronchitis, COPD, emphysema or pneumonia.    No problem-specific Assessment & Plan notes found for this encounter.   Past Medical History:  Diagnosis Date  . Arthritis   . Asthma   . Breast mass   . Depression   . Hypertension   . Mitral valve prolapse     Past Surgical History:  Procedure Laterality Date  . BACK SURGERY  2011  . BASAL CELL CARCINOMA EXCISION  12/2013  . BREAST CYST ASPIRATION Left   . CATARACT EXTRACTION Bilateral   . COLONOSCOPY  2016   cleared for 10 yrs- divert- Dr Eliberto Ivory [facility]  . FOOT NEUROMA SURGERY Left 2006  . LAPAROSCOPIC APPENDECTOMY N/A 11/05/2017   Procedure: APPENDECTOMY LAPAROSCOPIC;  Surgeon: Robert Bellow, MD;  Location: [facility] ORS;  Service: General;  Laterality: N/A;    Family History  Problem Relation Age of Onset  . Cancer Mother   . Liver cancer Mother 58  . Heart disease Father   . Asthma Brother   . Liver cancer Brother 81  . Heart disease Maternal Aunt        aortic valve replacement  . Heart disease Cousin        aortic valve replacement  Social History   Socioeconomic History  . Marital status: Married    Spouse name: Not on file  . Number of children: 2  . Years of education: Not on file  . Highest education level: Not on file  Occupational History  . Occupation: Chiropractor  Social Needs  . Financial resource strain: Not on file  . Food insecurity:    Worry: Not on file    Inability: Not on file  .  Transportation needs:    Medical: Not on file    Non-medical: Not on file  Tobacco Use  . Smoking status: Never Smoker  . Smokeless tobacco: Never Used  Substance and Sexual Activity  . Alcohol use: Yes    Alcohol/week: 1.2 oz    Types: 2 Glasses of wine per week  . Drug use: No  . Sexual activity: Yes    Partners: Male    Birth control/protection: Post-menopausal  Lifestyle  . Physical activity:    Days per week: Not on file    Minutes per session: Not on file  . Stress: Not on file  Relationships  . Social connections:    Talks on phone: Not on file    Gets together: Not on file    Attends religious service: Not on file    Active member of club or organization: Not on file    Attends meetings of clubs or organizations: Not on file    Relationship status: Not on file  . Intimate partner violence:    Fear of current or ex partner: Not on file    Emotionally abused: Not on file    Physically abused: Not on file    Forced sexual activity: Not on file  Other Topics Concern  . Not on file  Social History Narrative   Naraine has 6 grandchilden age 72 to 17 (4 girls and 2 boys)    Allergies  Allergen Reactions  . Ciprofloxacin Anaphylaxis    "Mouth swelling up"  . Sulfa Antibiotics Swelling  . Vioxx [Rofecoxib]   . Penicillins Rash  . Tape Rash    Outpatient Medications Prior to Visit  Medication Sig Dispense Refill  . albuterol-ipratropium (COMBIVENT) 18-103 MCG/ACT inhaler Inhale 1 puff into the lungs as needed. 1 Inhaler 11  . cholecalciferol (VITAMIN D) 1000 units tablet Take 1,000 Units by mouth daily.    Marland Kitchen conjugated estrogens (PREMARIN) vaginal cream Place 1 Applicatorful vaginally 2 (two) times a week. 42.5 g 11  . ibuprofen (ADVIL,MOTRIN) 200 MG tablet Take 1 tablet by mouth as needed.    . Ipratropium-Albuterol (COMBIVENT RESPIMAT) 20-100 MCG/ACT AERS respimat Inhale 1 puff into the lungs every 6 (six) hours. 1 Inhaler 6  . MAGNESIUM CARBONATE PO Take 1 capsule  by mouth daily.    . metoprolol succinate (TOPROL-XL) 50 MG 24 hr tablet TAKE 1 TABLET BY MOUTH DAILY WITH OR IMMEDIATELY FOLLOWING A MEAL 30 tablet 0  . metoprolol tartrate (LOPRESSOR) 25 MG tablet Take 1 tablet by mouth 2 (two) times daily. cardiologist    . Specialty Vitamins Products (MAGNESIUM, AMINO ACID CHELATE,) 133 MG tablet Take 1 tablet by mouth 2 (two) times daily.     Facility-Administered Medications Prior to Visit  Medication Dose Route Frequency Provider Last Rate Last Dose  . albuterol (PROVENTIL) (2.5 MG/3ML) 0.083% nebulizer solution 2.5 mg  2.5 mg Nebulization Once Latrelle Bazar C, MD      . Ipratropium-Albuterol (COMBIVENT) respimat 1 puff  1 puff Inhalation Q6H Shali Vesey C,  MD      . promethazine (PHENERGAN) injection 12.5 mg  12.5 mg Intravenous Q4H PRN Byrnett, Forest Gleason, MD        Review of Systems  Constitutional: Negative for chills, fever, malaise/fatigue and weight loss.  HENT: Positive for congestion. Negative for drooling, ear discharge, ear pain, hoarse voice, postnasal drip, rhinorrhea, sore throat and trouble swallowing.   Eyes: Negative for blurred vision.  Respiratory: Positive for cough. Negative for hemoptysis, sputum production, shortness of breath, wheezing and stridor.   Cardiovascular: Negative for chest pain, dyspnea on exertion, palpitations, leg swelling and PND.  Gastrointestinal: Positive for abdominal pain. Negative for blood in stool, constipation, diarrhea, heartburn, melena, nausea and vomiting.  Genitourinary: Negative for dysuria, frequency, hematuria and urgency.  Musculoskeletal: Negative for back pain, joint pain, myalgias and neck pain.  Skin: Negative for rash.  Neurological: Negative for dizziness, tingling, sensory change, focal weakness and headaches.  Endo/Heme/Allergies: Negative for environmental allergies and polydipsia. Does not bruise/bleed easily.  Psychiatric/Behavioral: Negative for depression and suicidal ideas. The  patient is not nervous/anxious and does not have insomnia.      Objective  Vitals:   11/13/17 1533  BP: 120/70  Pulse: 80  Resp: 14  Temp: 98.1 F (36.7 C)  Weight: 141 lb (64 kg)  Height: 5\' 2"  (1.575 m)    Physical Exam  Constitutional: She is oriented to person, place, and time. She appears well-developed and well-nourished.  HENT:  Head: Normocephalic.  Right Ear: Tympanic membrane, external ear and ear canal normal.  Left Ear: Tympanic membrane, external ear and ear canal normal.  Nose: Right sinus exhibits maxillary sinus tenderness. Left sinus exhibits maxillary sinus tenderness.  Mouth/Throat: Mucous membranes are normal. Posterior oropharyngeal erythema present. No oropharyngeal exudate or posterior oropharyngeal edema.  Eyes: Pupils are equal, round, and reactive to light. Conjunctivae, EOM and lids are normal. Lids are everted and swept, no foreign bodies found. Left eye exhibits no hordeolum. No foreign body present in the left eye. Right conjunctiva is not injected. Left conjunctiva is not injected. No scleral icterus.  Neck: Trachea normal and normal range of motion. Neck supple. Normal carotid pulses, no hepatojugular reflux and no JVD present. No tracheal tenderness present. Carotid bruit is not present. No tracheal deviation present. No thyroid mass and no thyromegaly present.  Cardiovascular: Normal rate, regular rhythm, S1 normal, S2 normal, normal heart sounds and intact distal pulses. Exam reveals no gallop, no S3, no S4, no distant heart sounds and no friction rub.  No murmur heard.  No systolic murmur is present.  No diastolic murmur is present. Pulses:      Carotid pulses are 2+ on the right side, and 2+ on the left side.      Radial pulses are 2+ on the right side, and 2+ on the left side.       Femoral pulses are 2+ on the right side, and 2+ on the left side.      Popliteal pulses are 2+ on the right side, and 2+ on the left side.       Dorsalis pedis  pulses are 2+ on the right side, and 2+ on the left side.       Posterior tibial pulses are 2+ on the right side, and 2+ on the left side.  Pulmonary/Chest: Effort normal. No stridor. No tachypnea. No respiratory distress. She has no decreased breath sounds. She has wheezes. She has rhonchi. She has no rales.  No rub  Abdominal:  Soft. Normal aorta and bowel sounds are normal. There is no hepatosplenomegaly. There is no rebound and no guarding.  Musculoskeletal: Normal range of motion. She exhibits no edema or tenderness.  Lymphadenopathy:       Head (right side): No submandibular adenopathy present.       Head (left side): No submandibular adenopathy present.    She has no cervical adenopathy.  Neurological: She is alert and oriented to person, place, and time. She has normal strength. She displays normal reflexes. No cranial nerve deficit.  Skin: Skin is warm. No rash noted.  Psychiatric: She has a normal mood and affect. Her mood appears not anxious. She does not exhibit a depressed mood.  Nursing note and vitals reviewed.     Assessment & Plan  Problem List Items Addressed This Visit      Respiratory   Asthma   Relevant Medications   albuterol (PROVENTIL) (2.5 MG/3ML) 0.083% nebulizer solution   albuterol (PROVENTIL) (2.5 MG/3ML) 0.083% nebulizer solution 2.5 mg   Other Relevant Orders   DG Chest 2 View (Completed)    Other Visit Diagnoses    Acute non-recurrent maxillary sinusitis    -  Primary   Relevant Medications   azithromycin (ZITHROMAX) 250 MG tablet   Cough       Relevant Orders   DG Chest 2 View (Completed)      Meds ordered this encounter  Medications  . albuterol (PROVENTIL) (2.5 MG/3ML) 0.083% nebulizer solution    Sig: Take 3 mLs (2.5 mg total) by nebulization every 6 (six) hours as needed for wheezing or shortness of breath.    Dispense:  75 mL    Refill:  12  . albuterol (PROVENTIL) (2.5 MG/3ML) 0.083% nebulizer solution 2.5 mg  . azithromycin  (ZITHROMAX) 250 MG tablet    Sig: 2 today then 1 a day for 4 days    Dispense:  6 tablet    Refill:  0  patient administered nebulized albuterol for airway reactivity.Pre pulse oximetry 96% and post 97%. Cough, air exchange, and phonation improved s/p nebulization.  Home nebulizer unit prescribed with albuterol. Chest xray for evaluation ?pneumonia or atelectasis.  Dr. [facility]  11/14/17

## 2017-11-13 NOTE — Patient Instructions (Signed)
Patient to see Dr. Ronnald Ramp today for cough and breathing.  Return as needed. The patient is aware to call back for any questions or concerns.

## 2017-11-13 NOTE — Progress Notes (Signed)
Patient ID: Leslie Silva, female   DOB: 12/04/1950, 66 y.o.   MRN: [id number]  Chief Complaint  Patient presents with  . Routine Post Op    HPI Leslie Silva is a 67 y.o. female here today for her post op appendectomy done on 5/13/209.  Patient states she has had a bad cough since surgery. No color just clear . Moves her bowels daily.    HPI  Past Medical History:  Diagnosis Date  . Arthritis   . Asthma   . Breast mass   . Depression   . Hypertension   . Mitral valve prolapse     Past Surgical History:  Procedure Laterality Date  . BACK SURGERY  2011  . BASAL CELL CARCINOMA EXCISION  12/2013  . BREAST CYST ASPIRATION Left   . CATARACT EXTRACTION Bilateral   . COLONOSCOPY  2016   cleared for 10 yrs- divert- Dr Eliberto Ivory [facility]  . FOOT NEUROMA SURGERY Left 2006  . LAPAROSCOPIC APPENDECTOMY N/A 11/05/2017   Procedure: APPENDECTOMY LAPAROSCOPIC;  Surgeon: Robert Bellow, MD;  Location: [facility] ORS;  Service: General;  Laterality: N/A;    Family History  Problem Relation Age of Onset  . Cancer Mother   . Liver cancer Mother 61  . Heart disease Father   . Asthma Brother   . Liver cancer Brother 70  . Heart disease Maternal Aunt        aortic valve replacement  . Heart disease Cousin        aortic valve replacement    Social History Social History   Tobacco Use  . Smoking status: Never Smoker  . Smokeless tobacco: Never Used  Substance Use Topics  . Alcohol use: Yes    Alcohol/week: 1.2 oz    Types: 2 Glasses of wine per week  . Drug use: No    Allergies  Allergen Reactions  . Ciprofloxacin Anaphylaxis    "Mouth swelling up"  . Sulfa Antibiotics Swelling  . Vioxx [Rofecoxib]   . Penicillins Rash  . Tape Rash    Current Outpatient Medications  Medication Sig Dispense Refill  . albuterol-ipratropium (COMBIVENT) 18-103 MCG/ACT inhaler Inhale 1 puff into the lungs as needed. 1 Inhaler 11  . cholecalciferol (VITAMIN D) 1000 units tablet Take 1,000 Units by  mouth daily.    Marland Kitchen conjugated estrogens (PREMARIN) vaginal cream Place 1 Applicatorful vaginally 2 (two) times a week. 42.5 g 11  . ibuprofen (ADVIL,MOTRIN) 200 MG tablet Take 1 tablet by mouth as needed.    . Ipratropium-Albuterol (COMBIVENT RESPIMAT) 20-100 MCG/ACT AERS respimat Inhale 1 puff into the lungs every 6 (six) hours. 1 Inhaler 6  . metoprolol succinate (TOPROL-XL) 50 MG 24 hr tablet TAKE 1 TABLET BY MOUTH DAILY WITH OR IMMEDIATELY FOLLOWING A MEAL 30 tablet 0  . albuterol (PROVENTIL) (2.5 MG/3ML) 0.083% nebulizer solution Take 3 mLs (2.5 mg total) by nebulization every 6 (six) hours as needed for wheezing or shortness of breath. 75 mL 12  . azithromycin (ZITHROMAX) 250 MG tablet 2 today then 1 a day for 4 days 6 tablet 0  . MAGNESIUM CARBONATE PO Take 1 capsule by mouth daily.    . metoprolol tartrate (LOPRESSOR) 25 MG tablet Take 1 tablet by mouth 2 (two) times daily. cardiologist     Current Facility-Administered Medications  Medication Dose Route Frequency Provider Last Rate Last Dose  . albuterol (PROVENTIL) (2.5 MG/3ML) 0.083% nebulizer solution 2.5 mg  2.5 mg Nebulization Once Juline Patch, MD      .  albuterol (PROVENTIL) (2.5 MG/3ML) 0.083% nebulizer solution 2.5 mg  2.5 mg Nebulization Once Jones, Deanna C, MD      . Ipratropium-Albuterol (COMBIVENT) respimat 1 puff  1 puff Inhalation Q6H Jones, Deanna C, MD      . promethazine (PHENERGAN) injection 12.5 mg  12.5 mg Intravenous Q4H PRN Mavrik Bynum, Forest Gleason, MD        Review of Systems Review of Systems  Constitutional: Negative.   Respiratory: Negative.   Cardiovascular: Negative.     Blood pressure 130/74, pulse 94, resp. rate 14, height 5\' 4"  (1.626 m), weight 141 lb (64 kg), SpO2 98 %.  Physical Exam Physical Exam  Constitutional: She is oriented to person, place, and time. She appears well-developed and well-nourished.  Cardiovascular: Normal rate, regular rhythm and normal heart sounds.  Pulmonary/Chest:  End  expiratory popping in all lung fields.   Abdominal:  Port sites are clean and healing well.   Neurological: She is oriented to person, place, and time.  Skin: Skin is warm and dry.    Data Reviewed  DIAGNOSIS:  A. APPENDIX; APPENDECTOMY:  - ACUTE SUPPURATIVE APPENDICITIS WITH SMALL PERFORATION AND SEROSITIS.  - NEGATIVE FOR NEOPLASM.  (Perforation was not clinically evident).  Assessment    Doing well s/p appendectomy. Persistent cough s/p intubation.     Plan  Patient to see Dr. Ronnald Ramp today for cough and breathing.  Return as needed. The patient is aware to call back for any questions or concerns.   HPI, Physical Exam, Assessment and Plan have been scribed under the direction and in the presence of Hervey Ard, MD.  Gaspar Cola, CMA  I have completed the exam and reviewed the above documentation for accuracy and completeness.  I agree with the above.  Haematologist has been used and any errors in dictation or transcription are unintentional.  Hervey Ard, M.D., F.A.C.S.  [name] 11/13/2017, 8:22 PM

## 2017-11-14 ENCOUNTER — Inpatient Hospital Stay: Payer: Medicare HMO | Admitting: Family Medicine

## 2017-11-18 NOTE — Discharge Summary (Signed)
Physician Discharge Summary  Patient ID: Leslie Silva MRN: [id number] DOB/AGE: 1950/10/20 67 y.o.  Admit date: 11/05/2017 Discharge date: 11/18/2017  Admission Diagnoses: Acute suppurative appendicitis  Discharge Diagnoses:  Active Problems:   Appendicitis, acute   Discharged Condition: good  Hospital Course: Patient was admitted overnight.  Discharged the morning after surgery in good condition.  Consults: None  Significant Diagnostic Studies: Pathology: Acute suppurative appendicitis with focal perforation.  Treatments: surgery: Appendectomy  Discharge Exam: Blood pressure 125/60, pulse 81, temperature 99 F (37.2 C), temperature source Oral, resp. rate 20, height 5\' 2"  (1.575 m), weight 143 lb 15.4 oz (65.3 kg), SpO2 95 %. General appearance: alert and cooperative Resp: clear to auscultation bilaterally Cardio: regular rate and rhythm, S1, S2 normal, no murmur, click, rub or gallop GI: soft, non-tender; bowel sounds normal; no masses,  no organomegaly Incision/Wound: Incisions healing well.  Disposition:   Discharge Instructions    Diet - low sodium heart healthy   Complete by:  As directed    Discharge instructions   Complete by:  As directed    Heating pad to abdomen for comfort.  Tylenol/ Advil: If needed for soreness.  Diet as tolerated.  No lifting over 10 pounds.  No driving until pain free.  OK to shower at any time.    May remove outer plastic dressings in three days if desired. Leave "butterfly" strip on skin until it loosens.   Increase activity slowly   Complete by:  As directed      Allergies as of 11/06/2017      Reactions   Ciprofloxacin Anaphylaxis   "Mouth swelling up"   Sulfa Antibiotics Swelling   Vioxx [rofecoxib]    Penicillins Rash   Tape Rash      Medication List    TAKE these medications   albuterol-ipratropium 18-103 MCG/ACT inhaler Commonly known as:  COMBIVENT Inhale 1 puff into the lungs as needed.    Ipratropium-Albuterol 20-100 MCG/ACT Aers respimat Commonly known as:  COMBIVENT RESPIMAT Inhale 1 puff into the lungs every 6 (six) hours.   cholecalciferol 1000 units tablet Commonly known as:  VITAMIN D Take 1,000 Units by mouth daily.   conjugated estrogens vaginal cream Commonly known as:  PREMARIN Place 1 Applicatorful vaginally 2 (two) times a week.   ibuprofen 200 MG tablet Commonly known as:  ADVIL,MOTRIN Take 1 tablet by mouth as needed.   metoprolol succinate 50 MG 24 hr tablet Commonly known as:  TOPROL-XL TAKE 1 TABLET BY MOUTH DAILY WITH OR IMMEDIATELY FOLLOWING A MEAL   metoprolol tartrate 25 MG tablet Commonly known as:  LOPRESSOR Take 1 tablet by mouth 2 (two) times daily. cardiologist      Follow-up Information    Juline Patch, MD. Go on 11/14/2017.   Specialty:  Family Medicine Why:  Wednesday at 9:30am for hospital follow-up Contact information: [address] [place] [address] [phone]        Robert Bellow, MD.   Specialties:  General Surgery, Radiology Why:  Dr. Bary Castilla office will notify you with your hospital follow-up if needed. Contact information: [address] [place] [address] [phone]           Signed: Robert Bellow 11/18/2017, 3:23 PM

## 2017-12-14 DIAGNOSIS — I10 Essential (primary) hypertension: Secondary | ICD-10-CM | POA: Diagnosis not present

## 2017-12-14 DIAGNOSIS — I499 Cardiac arrhythmia, unspecified: Secondary | ICD-10-CM | POA: Diagnosis not present

## 2017-12-18 ENCOUNTER — Encounter: Payer: Self-pay | Admitting: Family Medicine

## 2017-12-18 ENCOUNTER — Ambulatory Visit (INDEPENDENT_AMBULATORY_CARE_PROVIDER_SITE_OTHER): Payer: Medicare HMO | Admitting: Family Medicine

## 2017-12-18 ENCOUNTER — Ambulatory Visit
Admission: RE | Admit: 2017-12-18 | Discharge: 2017-12-18 | Disposition: A | Discharge status: Home / Self Care | Payer: Medicare HMO | Source: Ambulatory Visit | Attending: Family Medicine | Admitting: Family Medicine

## 2017-12-18 VITALS — BP 130/80 | HR 64 | Ht 62.0 in | Wt 140.0 lb

## 2017-12-18 DIAGNOSIS — M542 Cervicalgia: Secondary | ICD-10-CM | POA: Diagnosis not present

## 2017-12-18 DIAGNOSIS — M5010 Cervical disc disorder with radiculopathy, unspecified cervical region: Secondary | ICD-10-CM | POA: Diagnosis not present

## 2017-12-18 DIAGNOSIS — M47812 Spondylosis without myelopathy or radiculopathy, cervical region: Secondary | ICD-10-CM | POA: Diagnosis not present

## 2017-12-18 MED ORDER — IBUPROFEN 800 MG PO TABS: 800.0000 mg | ORAL_TABLET | Freq: Three times a day (TID) | ORAL | 0 refills | Status: DC | PRN

## 2017-12-18 MED ORDER — PREDNISONE 10 MG PO TABS: 10.0000 mg | ORAL_TABLET | Freq: Every day | ORAL | 0 refills | Status: DC

## 2017-12-18 NOTE — Progress Notes (Signed)
Name: Leslie Silva   MRN: [id number]    DOB: 25-Jan-1951   Date:12/18/2017       Progress Note  Subjective  Chief Complaint  Chief Complaint  Patient presents with  . Neck Pain    cervical pain with tingling and numbness down the R) arm. Pinky is numb/ weird sensation    Neck Pain   This is a chronic problem. The current episode started more than 1 year ago (Ongoing problems with my neck for 3 yrs). The problem occurs intermittently. The problem has been waxing and waning. The pain is associated with nothing. The pain is present in the right side. The quality of the pain is described as aching. The pain is moderate. Associated symptoms include tingling. Pertinent negatives include no chest pain, fever, headaches, leg pain, numbness, pain with swallowing, paresis, photophobia, syncope, trouble swallowing, visual change, weakness or weight loss. She has tried acetaminophen, NSAIDs and chiropractic manipulation (physical therapy) for the symptoms. The treatment provided no relief.    No problem-specific Assessment & Plan notes found for this encounter.   Past Medical History:  Diagnosis Date  . Arthritis   . Asthma   . Breast mass   . Depression   . Hypertension   . Mitral valve prolapse     Past Surgical History:  Procedure Laterality Date  . BACK SURGERY  2011  . BASAL CELL CARCINOMA EXCISION  12/2013  . BREAST CYST ASPIRATION Left   . CATARACT EXTRACTION Bilateral   . COLONOSCOPY  2016   cleared for 10 yrs- divert- Dr Eliberto Ivory [facility]  . FOOT NEUROMA SURGERY Left 2006  . LAPAROSCOPIC APPENDECTOMY N/A 11/05/2017   Procedure: APPENDECTOMY LAPAROSCOPIC;  Surgeon: Robert Bellow, MD;  Location: [facility] ORS;  Service: General;  Laterality: N/A;    Family History  Problem Relation Age of Onset  . Cancer Mother   . Liver cancer Mother 109  . Heart disease Father   . Asthma Brother   . Liver cancer Brother 68  . Heart disease Maternal Aunt        aortic valve replacement  . Heart  disease Cousin        aortic valve replacement    Social History   Socioeconomic History  . Marital status: Married    Spouse name: Not on file  . Number of children: 2  . Years of education: Not on file  . Highest education level: Not on file  Occupational History  . Occupation: Chiropractor  Social Needs  . Financial resource strain: Not on file  . Food insecurity:    Worry: Not on file    Inability: Not on file  . Transportation needs:    Medical: Not on file    Non-medical: Not on file  Tobacco Use  . Smoking status: Never Smoker  . Smokeless tobacco: Never Used  Substance and Sexual Activity  . Alcohol use: Yes    Alcohol/week: 1.2 oz    Types: 2 Glasses of wine per week  . Drug use: No  . Sexual activity: Yes    Partners: Male    Birth control/protection: Post-menopausal  Lifestyle  . Physical activity:    Days per week: Not on file    Minutes per session: Not on file  . Stress: Not on file  Relationships  . Social connections:    Talks on phone: Not on file    Gets together: Not on file    Attends religious service: Not on file  Active member of club or organization: Not on file    Attends meetings of clubs or organizations: Not on file    Relationship status: Not on file  . Intimate partner violence:    Fear of current or ex partner: Not on file    Emotionally abused: Not on file    Physically abused: Not on file    Forced sexual activity: Not on file  Other Topics Concern  . Not on file  Social History Narrative   Music has 6 grandchilden age 37 to 83 (4 girls and 2 boys)    Allergies  Allergen Reactions  . Ciprofloxacin Anaphylaxis    "Mouth swelling up"  . Sulfa Antibiotics Swelling  . Vioxx [Rofecoxib]   . Penicillins Rash  . Tape Rash    Outpatient Medications Prior to Visit  Medication Sig Dispense Refill  . albuterol (PROVENTIL) (2.5 MG/3ML) 0.083% nebulizer solution Take 3 mLs (2.5 mg total) by nebulization every 6 (six) hours  as needed for wheezing or shortness of breath. 75 mL 12  . albuterol-ipratropium (COMBIVENT) 18-103 MCG/ACT inhaler Inhale 1 puff into the lungs as needed. 1 Inhaler 11  . cholecalciferol (VITAMIN D) 1000 units tablet Take 1,000 Units by mouth daily.    Marland Kitchen conjugated estrogens (PREMARIN) vaginal cream Place 1 Applicatorful vaginally 2 (two) times a week. 42.5 g 11  . ibuprofen (ADVIL,MOTRIN) 200 MG tablet Take 1 tablet by mouth as needed.    . Ipratropium-Albuterol (COMBIVENT RESPIMAT) 20-100 MCG/ACT AERS respimat Inhale 1 puff into the lungs every 6 (six) hours. 1 Inhaler 6  . MAGNESIUM CARBONATE PO Take 1 capsule by mouth daily.    . metoprolol succinate (TOPROL-XL) 50 MG 24 hr tablet TAKE 1 TABLET BY MOUTH DAILY WITH OR IMMEDIATELY FOLLOWING A MEAL 30 tablet 0  . metoprolol tartrate (LOPRESSOR) 25 MG tablet Take 1 tablet by mouth as needed. cardiologist    . azithromycin (ZITHROMAX) 250 MG tablet 2 today then 1 a day for 4 days 6 tablet 0   Facility-Administered Medications Prior to Visit  Medication Dose Route Frequency Provider Last Rate Last Dose  . albuterol (PROVENTIL) (2.5 MG/3ML) 0.083% nebulizer solution 2.5 mg  2.5 mg Nebulization Once Christina Gintz C, MD      . albuterol (PROVENTIL) (2.5 MG/3ML) 0.083% nebulizer solution 2.5 mg  2.5 mg Nebulization Once Jonie Burdell C, MD      . Ipratropium-Albuterol (COMBIVENT) respimat 1 puff  1 puff Inhalation Q6H Charidy Cappelletti C, MD      . promethazine (PHENERGAN) injection 12.5 mg  12.5 mg Intravenous Q4H PRN Byrnett, Forest Gleason, MD        Review of Systems  Constitutional: Negative for chills, fever, malaise/fatigue and weight loss.  HENT: Negative for ear discharge, ear pain, sore throat and trouble swallowing.   Eyes: Negative for blurred vision and photophobia.  Respiratory: Negative for cough, sputum production, shortness of breath and wheezing.   Cardiovascular: Negative for chest pain, palpitations, leg swelling and syncope.   Gastrointestinal: Negative for abdominal pain, blood in stool, constipation, diarrhea, heartburn, melena and nausea.  Genitourinary: Negative for dysuria, frequency, hematuria and urgency.  Musculoskeletal: Positive for neck pain. Negative for back pain, joint pain and myalgias.  Skin: Negative for rash.  Neurological: Positive for tingling. Negative for dizziness, sensory change, focal weakness, weakness, numbness and headaches.  Endo/Heme/Allergies: Negative for environmental allergies and polydipsia. Does not bruise/bleed easily.  Psychiatric/Behavioral: Negative for depression and suicidal ideas. The patient is not nervous/anxious and  does not have insomnia.      Objective  Vitals:   12/18/17 1453  BP: 130/80  Pulse: 64  Weight: 140 lb (63.5 kg)  Height: 5\' 2"  (1.575 m)    Physical Exam  Constitutional: She is oriented to person, place, and time. She appears well-developed and well-nourished.  HENT:  Head: Normocephalic.  Right Ear: External ear normal.  Left Ear: External ear normal.  Mouth/Throat: Oropharynx is clear and moist.  Eyes: Pupils are equal, round, and reactive to light. Conjunctivae and EOM are normal. Lids are everted and swept, no foreign bodies found. Left eye exhibits no hordeolum. No foreign body present in the left eye. Right conjunctiva is not injected. Left conjunctiva is not injected. No scleral icterus.  Neck: Normal range of motion. Neck supple. No JVD present. No tracheal deviation present. No thyromegaly present.  Cardiovascular: Normal rate, regular rhythm, normal heart sounds and intact distal pulses. Exam reveals no gallop and no friction rub.  No murmur heard. Pulmonary/Chest: Effort normal and breath sounds normal. No respiratory distress. She has no wheezes. She has no rales.  Abdominal: Soft. Bowel sounds are normal. She exhibits no mass. There is no hepatosplenomegaly. There is no tenderness. There is no rebound and no guarding.   Musculoskeletal: Normal range of motion. She exhibits no edema or tenderness.  Lymphadenopathy:    She has no cervical adenopathy.  Neurological: She is alert and oriented to person, place, and time. She has normal strength. She displays normal reflexes. A sensory deficit is present. No cranial nerve deficit.  Reflex Scores:      Tricep reflexes are 2+ on the right side and 2+ on the left side.      Bicep reflexes are 2+ on the right side and 2+ on the left side.      Brachioradialis reflexes are 2+ on the right side and 2+ on the left side. Decreased ulna sensory right  Skin: Skin is warm. No rash noted.  Psychiatric: She has a normal mood and affect. Her mood appears not anxious. She does not exhibit a depressed mood.  Nursing note and vitals reviewed.     Assessment & Plan  Problem List Items Addressed This Visit    None    Visit Diagnoses    Cervical disc disorder with radiculopathy    -  Primary   Involve right ulna nerve   Relevant Medications   predniSONE (DELTASONE) 10 MG tablet   ibuprofen (ADVIL,MOTRIN) 800 MG tablet   Other Relevant Orders   DG Cervical Spine Complete      Meds ordered this encounter  Medications  . predniSONE (DELTASONE) 10 MG tablet    Sig: Take 1 tablet (10 mg total) by mouth daily with breakfast.    Dispense:  30 tablet    Refill:  0  . ibuprofen (ADVIL,MOTRIN) 800 MG tablet    Sig: Take 1 tablet (800 mg total) by mouth every 8 (eight) hours as needed.    Dispense:  30 tablet    Refill:  0      Dr. [facility]  12/18/17

## 2018-01-14 DIAGNOSIS — L821 Other seborrheic keratosis: Secondary | ICD-10-CM | POA: Diagnosis not present

## 2018-01-14 DIAGNOSIS — L570 Actinic keratosis: Secondary | ICD-10-CM | POA: Diagnosis not present

## 2018-01-14 DIAGNOSIS — Z85828 Personal history of other malignant neoplasm of skin: Secondary | ICD-10-CM | POA: Diagnosis not present

## 2018-01-14 DIAGNOSIS — L814 Other melanin hyperpigmentation: Secondary | ICD-10-CM | POA: Diagnosis not present

## 2018-01-14 DIAGNOSIS — L820 Inflamed seborrheic keratosis: Secondary | ICD-10-CM | POA: Diagnosis not present

## 2018-03-04 DIAGNOSIS — L814 Other melanin hyperpigmentation: Secondary | ICD-10-CM | POA: Diagnosis not present

## 2018-03-04 DIAGNOSIS — L570 Actinic keratosis: Secondary | ICD-10-CM | POA: Diagnosis not present

## 2018-03-04 DIAGNOSIS — L820 Inflamed seborrheic keratosis: Secondary | ICD-10-CM | POA: Diagnosis not present

## 2018-03-25 ENCOUNTER — Other Ambulatory Visit: Payer: Self-pay

## 2018-03-25 MED ORDER — SCOPOLAMINE 1 MG/3DAYS TD PT72: 1.0000 | MEDICATED_PATCH | TRANSDERMAL | 0 refills | Status: DC

## 2018-03-25 NOTE — Progress Notes (Unsigned)
Sent in scopolamine patches for cruise for patient

## 2018-04-25 DIAGNOSIS — R69 Illness, unspecified: Secondary | ICD-10-CM | POA: Diagnosis not present

## 2018-05-10 DIAGNOSIS — R69 Illness, unspecified: Secondary | ICD-10-CM | POA: Diagnosis not present

## 2018-05-14 ENCOUNTER — Ambulatory Visit (INDEPENDENT_AMBULATORY_CARE_PROVIDER_SITE_OTHER): Payer: Medicare HMO | Admitting: General Surgery

## 2018-05-14 ENCOUNTER — Other Ambulatory Visit: Payer: Self-pay

## 2018-05-14 ENCOUNTER — Ambulatory Visit
Admission: RE | Admit: 2018-05-14 | Discharge: 2018-05-14 | Disposition: A | Discharge status: Home / Self Care | Payer: Medicare HMO | Source: Ambulatory Visit | Attending: Family Medicine | Admitting: Family Medicine

## 2018-05-14 ENCOUNTER — Ambulatory Visit (INDEPENDENT_AMBULATORY_CARE_PROVIDER_SITE_OTHER): Payer: Medicare HMO | Admitting: Family Medicine

## 2018-05-14 ENCOUNTER — Encounter: Payer: Self-pay | Admitting: Family Medicine

## 2018-05-14 ENCOUNTER — Encounter: Payer: Self-pay | Admitting: General Surgery

## 2018-05-14 ENCOUNTER — Other Ambulatory Visit
Admission: RE | Admit: 2018-05-14 | Discharge: 2018-05-14 | Disposition: A | Discharge status: Home / Self Care | Payer: Medicare HMO | Source: Ambulatory Visit | Attending: Family Medicine | Admitting: Family Medicine

## 2018-05-14 VITALS — BP 120/70 | HR 88 | Temp 98.9°F | Ht 62.0 in | Wt 143.0 lb

## 2018-05-14 VITALS — BP 147/84 | HR 71 | Temp 97.7°F | Resp 14 | Ht 61.0 in | Wt 142.8 lb

## 2018-05-14 DIAGNOSIS — R1011 Right upper quadrant pain: Secondary | ICD-10-CM

## 2018-05-14 LAB — COMPREHENSIVE METABOLIC PANEL
ALBUMIN: 4.8 g/dL (ref 3.5–5.0)
ALT: 31 U/L (ref 0–44)
AST: 26 U/L (ref 15–41)
Alkaline Phosphatase: 84 U/L (ref 38–126)
Anion gap: 10 (ref 5–15)
BUN: 15 mg/dL (ref 8–23)
CO2: 28 mmol/L (ref 22–32)
CREATININE: 0.5 mg/dL (ref 0.44–1.00)
Calcium: 9.3 mg/dL (ref 8.9–10.3)
Chloride: 103 mmol/L (ref 98–111)
GFR calc Af Amer: >60 mL/min (ref >60)
GLUCOSE: 112 mg/dL — AB (ref 70–99)
POTASSIUM: 4.1 mmol/L (ref 3.5–5.1)
Sodium: 141 mmol/L (ref 135–145)
Total Bilirubin: 0.9 mg/dL (ref 0.3–1.2)
Total Protein: 7.7 g/dL (ref 6.5–8.1)

## 2018-05-14 LAB — CBC WITH DIFFERENTIAL/PLATELET
Abs Immature Granulocytes: 0.01 10*3/uL (ref 0.00–0.07)
Basophils Absolute: 0 10*3/uL (ref 0.0–0.1)
Basophils Relative: 1 %
EOS ABS: 0.2 10*3/uL (ref 0.0–0.5)
EOS PCT: 3 %
HEMATOCRIT: 40.9 % (ref 36.0–46.0)
Hemoglobin: 13.9 g/dL (ref 12.0–15.0)
Immature Granulocytes: 0 %
Lymphocytes Relative: 23 %
Lymphs Abs: 1.3 10*3/uL (ref 0.7–4.0)
MCH: 32.6 pg (ref 26.0–34.0)
MCHC: 34 g/dL (ref 30.0–36.0)
MCV: 96 fL (ref 80.0–100.0)
MONO ABS: 0.4 10*3/uL (ref 0.1–1.0)
MONOS PCT: 7 %
Neutro Abs: 3.7 10*3/uL (ref 1.7–7.7)
Neutrophils Relative %: 66 %
Platelets: 220 10*3/uL (ref 150–400)
RBC: 4.26 MIL/uL (ref 3.87–5.11)
RDW: 12.9 % (ref 11.5–15.5)
WBC: 5.5 10*3/uL (ref 4.0–10.5)
nRBC: 0 % (ref 0.0–0.2)

## 2018-05-14 LAB — LIPASE, BLOOD: LIPASE: 30 U/L (ref 11–51)

## 2018-05-14 NOTE — Progress Notes (Signed)
Date:  05/14/2018   Name:  Leslie Silva   DOB:  1951/06/01   MRN:  [id number]   Chief Complaint: Abdominal Pain (RUQ pain started around 10:00 last night- different feeling this time from the last. no change in bowel habits, just pain) Abdominal Pain  This is a new problem. The current episode started yesterday. The onset quality is sudden. The problem occurs constantly. The problem has been waxing and waning. The pain is located in the RUQ. The pain is at a severity of 4/10. The pain is moderate. The quality of the pain is a sensation of fullness and colicky. The abdominal pain radiates to the back and right flank. Associated symptoms include a fever. Pertinent negatives include no constipation, diarrhea, dysuria, frequency, headaches, hematochezia, hematuria, melena, myalgias or nausea. The pain is aggravated by movement. The pain is relieved by certain positions and recumbency. The treatment provided no relief. Her past medical history is significant for abdominal surgery. There is no history of gallstones, GERD, pancreatitis or PUD.     Review of Systems  Constitutional: Positive for fever. Negative for chills.  HENT: Negative for drooling, ear discharge, ear pain and sore throat.   Respiratory: Negative for cough, shortness of breath and wheezing.   Cardiovascular: Negative for chest pain, palpitations and leg swelling.  Gastrointestinal: Positive for abdominal pain. Negative for blood in stool, constipation, diarrhea, hematochezia, melena and nausea.  Endocrine: Negative for polydipsia.  Genitourinary: Negative for dysuria, frequency, hematuria and urgency.  Musculoskeletal: Negative for back pain, myalgias and neck pain.  Skin: Negative for rash.  Allergic/Immunologic: Negative for environmental allergies.  Neurological: Negative for dizziness and headaches.  Hematological: Does not bruise/bleed easily.  Psychiatric/Behavioral: Negative for suicidal ideas. The patient is not  nervous/anxious.     Patient Active Problem List   Diagnosis Date Noted  . Acute appendicitis with perforation, localized peritonitis, and gangrene, without abscess 11/05/2017  . Appendicitis, acute 11/05/2017  . Osteoporosis 02/23/2017  . Asthma 01/13/2017  . Insomnia 01/13/2017  . Arthritis 03/12/2015  . Acid reflux 03/12/2015  . BP (high blood pressure) 03/12/2015  . Disc disorder 03/12/2015  . Cardiac conduction disorder 05/19/2014  . Basal cell carcinoma 12/24/2013  . Diverticulitis 08/07/2013    Allergies  Allergen Reactions  . Ciprofloxacin Anaphylaxis    "Mouth swelling up"  . Sulfa Antibiotics Swelling  . Vioxx [Rofecoxib]   . Penicillins Rash  . Tape Rash    Past Surgical History:  Procedure Laterality Date  . BACK SURGERY  2011  . BASAL CELL CARCINOMA EXCISION  12/2013  . BREAST CYST ASPIRATION Left   . CATARACT EXTRACTION Bilateral   . COLONOSCOPY  2016   cleared for 10 yrs- divert- Dr Eliberto Ivory Duke  . FOOT NEUROMA SURGERY Left 2006  . LAPAROSCOPIC APPENDECTOMY N/A 11/05/2017   Procedure: APPENDECTOMY LAPAROSCOPIC;  Surgeon: Robert Bellow, MD;  Location: [facility] ORS;  Service: General;  Laterality: N/A;    Social History   Tobacco Use  . Smoking status: Never Smoker  . Smokeless tobacco: Never Used  Substance Use Topics  . Alcohol use: Yes    Alcohol/week: 2.0 standard drinks    Types: 2 Glasses of wine per week  . Drug use: No     Medication list has been reviewed and updated.  Current Meds  Medication Sig  . albuterol (PROVENTIL) (2.5 MG/3ML) 0.083% nebulizer solution Take 3 mLs (2.5 mg total) by nebulization every 6 (six) hours as needed for wheezing or  shortness of breath.  Marland Kitchen albuterol-ipratropium (COMBIVENT) 18-103 MCG/ACT inhaler Inhale 1 puff into the lungs as needed.  . cholecalciferol (VITAMIN D) 1000 units tablet Take 1,000 Units by mouth daily.  Marland Kitchen conjugated estrogens (PREMARIN) vaginal cream Place 1 Applicatorful vaginally 2 (two)  times a week.  Marland Kitchen ibuprofen (ADVIL,MOTRIN) 200 MG tablet Take 1 tablet by mouth as needed.  Marland Kitchen MAGNESIUM CARBONATE PO Take 1 capsule by mouth daily.  . metoprolol succinate (TOPROL-XL) 50 MG 24 hr tablet TAKE 1 TABLET BY MOUTH DAILY WITH OR IMMEDIATELY FOLLOWING A MEAL  . metoprolol tartrate (LOPRESSOR) 25 MG tablet Take 1 tablet by mouth as needed. cardiologist   Current Facility-Administered Medications for the 05/14/18 encounter (Office Visit) with Juline Patch, MD  Medication  . albuterol (PROVENTIL) (2.5 MG/3ML) 0.083% nebulizer solution 2.5 mg  . albuterol (PROVENTIL) (2.5 MG/3ML) 0.083% nebulizer solution 2.5 mg  . Ipratropium-Albuterol (COMBIVENT) respimat 1 puff    PHQ 2/9 Scores 11/05/2017  PHQ - 2 Score 0    Physical Exam  Constitutional: She is oriented to person, place, and time. She appears well-developed and well-nourished.  HENT:  Head: Normocephalic.  Right Ear: External ear normal.  Left Ear: External ear normal.  Mouth/Throat: Oropharynx is clear and moist.  Eyes: Pupils are equal, round, and reactive to light. Conjunctivae and EOM are normal. Lids are everted and swept, no foreign bodies found. Left eye exhibits no hordeolum. No foreign body present in the left eye. Right conjunctiva is not injected. Left conjunctiva is not injected. No scleral icterus.  Neck: Normal range of motion. Neck supple. No JVD present. No tracheal deviation present. No thyromegaly present.  Cardiovascular: Normal rate, regular rhythm, normal heart sounds and intact distal pulses. Exam reveals no gallop and no friction rub.  No murmur heard. Pulmonary/Chest: Effort normal and breath sounds normal. No respiratory distress. She has no wheezes. She has no rales.  Abdominal: Soft. Bowel sounds are normal. She exhibits no abdominal bruit and no mass. There is no hepatosplenomegaly. There is no tenderness. There is no rebound and no guarding.  Musculoskeletal: Normal range of motion. She exhibits  no edema or tenderness.  Lymphadenopathy:    She has no cervical adenopathy.  Neurological: She is alert and oriented to person, place, and time. She has normal strength. She displays normal reflexes. No cranial nerve deficit.  Skin: Skin is warm. No rash noted.  Psychiatric: She has a normal mood and affect. Her mood appears not anxious. She does not exhibit a depressed mood.  Nursing note and vitals reviewed.   BP 120/70   Pulse 88   Temp 98.9 F (37.2 C) (Oral)   Ht 5\' 2"  (1.575 m)   Wt 143 lb (64.9 kg)   BMI 26.16 kg/m   Assessment and Plan:  1. Abdominal pain, RUQ Acute. Unstable . Obtain CMP.CBC, and lipase. STAT abdominal ultrasound with surgical consult to follow. - US Abdomen Complete; Future   Dr. Otilio Miu [facility]  05/14/2018

## 2018-05-14 NOTE — Progress Notes (Signed)
Patient ID: Leslie Silva, female   DOB: [birth year], 67 y.o.   MRN: [id number]  Chief Complaint  Patient presents with  . New Patient (Initial Visit)    RUQ pain    HPI Leslie Silva is a 67 y.o. female.  Here today for right upper quadrant pain. Denies nausea or vomiting. Has low grade fever today. Pain started suddenly in the right upper quadrant. Minimal symptoms of radiation. She had vegetable soup and fried chicken tenders several hours before the onset of the pain.Marland Kitchen Has had 3 similar, albeit less severe episodes of right upper quadrant since August of this year. Nausea without vomiting when the pain is most severe. Normal bowel movements. No noted change in urine color.  Temperature of 98.9 at her PCP office earlier this morning. HPI  Past Medical History:  Diagnosis Date  . Arthritis   . Asthma   . Breast mass   . Depression   . Hypertension   . Mitral valve prolapse     Past Surgical History:  Procedure Laterality Date  . BACK SURGERY  2011  . BASAL CELL CARCINOMA EXCISION  12/2013  . BREAST CYST ASPIRATION Left   . CATARACT EXTRACTION Bilateral   . COLONOSCOPY  08/27/2013   cleared for 10 yrs- divert- Dr Eliberto Ivory Duke  . FOOT NEUROMA SURGERY Left 2006  . LAPAROSCOPIC APPENDECTOMY N/A 11/05/2017   Procedure: APPENDECTOMY LAPAROSCOPIC;  Surgeon: Robert Bellow, MD;  Location: [facility] ORS;  Service: General;  Laterality: N/A;    Family History  Problem Relation Age of Onset  . Cancer Mother   . Liver cancer Mother 28  . Heart disease Father   . Asthma Brother   . Liver cancer Brother 42  . Heart disease Maternal Aunt        aortic valve replacement  . Heart disease Cousin        aortic valve replacement    Social History Social History   Tobacco Use  . Smoking status: Never Smoker  . Smokeless tobacco: Never Used  Substance Use Topics  . Alcohol use: Yes    Alcohol/week: 2.0 standard drinks    Types: 2 Glasses of wine per week  . Drug use: No     Allergies  Allergen Reactions  . Ciprofloxacin Anaphylaxis    "Mouth swelling up"  . Sulfa Antibiotics Swelling  . Vioxx [Rofecoxib]   . Penicillins Rash  . Tape Rash    Current Outpatient Medications  Medication Sig Dispense Refill  . albuterol (PROVENTIL) (2.5 MG/3ML) 0.083% nebulizer solution Take 3 mLs (2.5 mg total) by nebulization every 6 (six) hours as needed for wheezing or shortness of breath. 75 mL 12  . albuterol-ipratropium (COMBIVENT) 18-103 MCG/ACT inhaler Inhale 1 puff into the lungs as needed. 1 Inhaler 11  . cholecalciferol (VITAMIN D) 1000 units tablet Take 1,000 Units by mouth daily.    Marland Kitchen conjugated estrogens (PREMARIN) vaginal cream Place 1 Applicatorful vaginally 2 (two) times a week. 42.5 g 11  . ibuprofen (ADVIL,MOTRIN) 200 MG tablet Take 1 tablet by mouth as needed.    . loteprednol (ALREX) 0.2 % SUSP as needed (for allergies)    . MAGNESIUM CARBONATE PO Take 1 capsule by mouth daily.    . metoprolol succinate (TOPROL-XL) 50 MG 24 hr tablet TAKE 1 TABLET BY MOUTH DAILY WITH OR IMMEDIATELY FOLLOWING A MEAL 30 tablet 0  . metoprolol tartrate (LOPRESSOR) 25 MG tablet Take 1 tablet by mouth as needed. cardiologist  Current Facility-Administered Medications  Medication Dose Route Frequency Provider Last Rate Last Dose  . albuterol (PROVENTIL) (2.5 MG/3ML) 0.083% nebulizer solution 2.5 mg  2.5 mg Nebulization Once Jones, Deanna C, MD      . albuterol (PROVENTIL) (2.5 MG/3ML) 0.083% nebulizer solution 2.5 mg  2.5 mg Nebulization Once Juline Patch, MD      . Ipratropium-Albuterol (COMBIVENT) respimat 1 puff  1 puff Inhalation Q6H Juline Patch, MD        Review of Systems Review of Systems  Constitutional: Positive for chills. Negative for appetite change, diaphoresis, fatigue and unexpected weight change.  HENT: Negative.   Eyes: Negative.   Respiratory: Negative.   Cardiovascular: Negative.   Gastrointestinal: Positive for abdominal pain.   Endocrine: Negative.   Genitourinary: Negative.   Musculoskeletal: Negative.   Skin: Negative.   Allergic/Immunologic: Negative.   Neurological: Negative.   Hematological: Negative.   Psychiatric/Behavioral: Negative.     Blood pressure (!) 147/84, pulse 71, temperature 97.7 F (36.5 C), temperature source Temporal, resp. rate 14, height 5\' 1"  (1.549 m), weight 142 lb 12.8 oz (64.8 kg), SpO2 96 %.  Physical Exam Physical Exam  Constitutional: She is oriented to person, place, and time. She appears well-developed and well-nourished.  HENT:  Head: Normocephalic and atraumatic.  Eyes: Pupils are equal, round, and reactive to light. No scleral icterus.  Neck: Neck supple. No thyromegaly present.  Cardiovascular: Normal rate and regular rhythm.  Murmur heard.  Systolic murmur is present with a grade of 2/6. Pulmonary/Chest: Effort normal and breath sounds normal.  Abdominal: Normal appearance and bowel sounds are normal. There is tenderness in the right upper quadrant.    No back tenderness to percussion.   Musculoskeletal: Normal range of motion.  Neurological: She is alert and oriented to person, place, and time.  Skin: Skin is warm and dry.  Psychiatric: She has a normal mood and affect. Her behavior is normal. Judgment and thought content normal.    Data Reviewed Abdominal ultrasound obtained earlier in the day was negative for evidence of cholelithiasis, pericholecystic fluid or other acute inflammatory process.  Laboratory studies from earlier and they were reviewed.  Normal electrolytes and renal function.  Minimal elevation of blood sugar (nonfasting).  Lipase 30.  CBC was normal with white blood cell count of 5500 and a normal differential.  Hemoglobin 13.9 with an MCV of 96.  Platelet count of 220,000.   Assessment    Right upper quadrant pain, no evidence of acute cholecystitis on imaging or laboratory studies.  Plan    Based on today's exam, I anticipate this  being a self-limiting process.  I have asked patient to call the office the morning of Wednesday, November 21 with a progress report.  She was encouraged to make use of a heating pad for comfort.    HPI, Physical Exam, Assessment and Plan have been scribed under the direction and in the presence of Robert Bellow, MD. Jonnie Finner, CMA  I have completed the exam and reviewed the above documentation for accuracy and completeness.  I agree with the above.  Haematologist has been used and any errors in dictation or transcription are unintentional.  Hervey Ard, M.D., F.A.C.S.  Leslie Silva 05/15/2018, 3:12 PM

## 2018-05-14 NOTE — Patient Instructions (Signed)
Please call our office tomorrow  and let us know how you are doing. You may use a heating pad to the area.

## 2018-05-15 ENCOUNTER — Telehealth: Payer: Self-pay | Admitting: *Deleted

## 2018-05-15 ENCOUNTER — Telehealth: Payer: Self-pay | Admitting: General Surgery

## 2018-05-15 DIAGNOSIS — R1011 Right upper quadrant pain: Secondary | ICD-10-CM | POA: Insufficient documentation

## 2018-05-15 NOTE — Telephone Encounter (Signed)
Patient has called back with an update, as asked to do from Dr Bary Castilla at her visit yesterday.   She states that she was able to sleep last night. Patient states that she still is having RUQ dull pain that has been consistent all this morning. No nausea, vomiting or fever. Patient's last BM was yesterday morning. She has had flatulence. Is urinating with no pain. Decrease in appetite. She did state that she was able to eat a little last night-boiled chicken with a piece of rye bread. I asked the patient what she had for dinner the night her pain was severe. She stated that she had vegetable soup and chicken fingers (lightly battered with little grease).   She has been NPO since midnight incase of any imaging that may need to be done this morning.   Please advise.

## 2018-05-15 NOTE — Telephone Encounter (Signed)
Patient has called again and states that her pain has know start becoming more noticeable now that she is moving around more this morning. She is still NPO and would like to see if Dr Bary Castilla will order her imaging of his preference, due to her still being NPO and increased pain.   Please call patient and advise.

## 2018-05-15 NOTE — Telephone Encounter (Signed)
HIDA scan with [facility] at 12:00 05-16-18, arrive at 11:30, NPO after 6am, no GI meds or pain meds prior.  She also remembers that she has had for 1 year under right breast at ribs burning pain that comes and goes, unable to wear a bra when it hurts, lasting several hours. She admits to reflux but attributes it to what she eats, like chocolate and steak with salad .

## 2018-05-15 NOTE — Telephone Encounter (Signed)
I talked with the patient around lunchtime, aware Dr Bary Castilla in surgery. She states she had 2 episodes of bad "spasm" pain this morning but in between just "sore".  No vomiting. She states not eating or drinking anything has not had anything to do with the pain. She is comfortable at this time and aware Dr Bary Castilla in surgery til around 3, she is aware that I will call her back after that time.

## 2018-05-16 ENCOUNTER — Ambulatory Visit (INDEPENDENT_AMBULATORY_CARE_PROVIDER_SITE_OTHER): Payer: Medicare HMO | Admitting: General Surgery

## 2018-05-16 ENCOUNTER — Encounter: Payer: Self-pay | Admitting: General Surgery

## 2018-05-16 ENCOUNTER — Other Ambulatory Visit: Payer: Self-pay

## 2018-05-16 ENCOUNTER — Ambulatory Visit
Admission: RE | Admit: 2018-05-16 | Discharge: 2018-05-16 | Disposition: A | Discharge status: Home / Self Care | Payer: Medicare HMO | Source: Ambulatory Visit | Attending: General Surgery | Admitting: General Surgery

## 2018-05-16 VITALS — BP 122/78 | HR 80 | Temp 97.7°F | Resp 12 | Ht 61.0 in | Wt 141.0 lb

## 2018-05-16 DIAGNOSIS — R1011 Right upper quadrant pain: Secondary | ICD-10-CM | POA: Insufficient documentation

## 2018-05-16 MED ORDER — TECHNETIUM TC 99M MEBROFENIN IV KIT
5.2900 | PACK | Freq: Once | INTRAVENOUS | Status: AC | PRN
  Administered 2018-05-16: 5.29 via INTRAVENOUS

## 2018-05-16 NOTE — Progress Notes (Signed)
Patient ID: Leslie Silva, female   DOB: Apr 13, 1951, 67 y.o.   MRN: [id number]  Chief Complaint  Patient presents with  . Follow-up    HIDA scan    HPI Leslie Silva is a 67 y.o. female.  Here for follow up HIDA scan done today. She states she has not noticed any pain since yesterday afternoon around 3pm. Bowels move daily.   History of IBS when she was younger. She also remembers that she has had for 1 year under right breast at ribs "burning" pain that comes and goes, unable to wear a bra when it hurts, lasting several hours to all day. She had shingles at age 24.  HPI  Past Medical History:  Diagnosis Date  . Arthritis   . Asthma   . Breast mass   . Depression   . Diverticulosis   . Hypertension   . Mitral valve prolapse   . Shingles    age 20     Past Surgical History:  Procedure Laterality Date  . BACK SURGERY  2011  . BASAL CELL CARCINOMA EXCISION  12/2013  . BREAST CYST ASPIRATION Left   . CATARACT EXTRACTION Bilateral   . COLONOSCOPY  08/27/2013   cleared for 10 yrs- divert- Dr Eliberto Ivory Duke  . FOOT NEUROMA SURGERY Left 2006  . LAPAROSCOPIC APPENDECTOMY N/A 11/05/2017   Procedure: APPENDECTOMY LAPAROSCOPIC;  Surgeon: Robert Bellow, MD;  Location: [facility] ORS;  Service: General;  Laterality: N/A;    Family History  Problem Relation Age of Onset  . Cancer Mother   . Liver cancer Mother 20  . Heart disease Father   . Asthma Brother   . Liver cancer Brother 71  . Heart disease Maternal Aunt        aortic valve replacement  . Heart disease Cousin        aortic valve replacement    Social History Social History   Tobacco Use  . Smoking status: Never Smoker  . Smokeless tobacco: Never Used  Substance Use Topics  . Alcohol use: Yes    Alcohol/week: 2.0 standard drinks    Types: 2 Glasses of wine per week  . Drug use: No    Allergies  Allergen Reactions  . Ciprofloxacin Anaphylaxis    "Mouth swelling up"  . Sulfa Antibiotics Swelling  . Vioxx  [Rofecoxib]   . Penicillins Rash  . Tape Rash    Current Outpatient Medications  Medication Sig Dispense Refill  . albuterol (PROVENTIL) (2.5 MG/3ML) 0.083% nebulizer solution Take 3 mLs (2.5 mg total) by nebulization every 6 (six) hours as needed for wheezing or shortness of breath. 75 mL 12  . albuterol-ipratropium (COMBIVENT) 18-103 MCG/ACT inhaler Inhale 1 puff into the lungs as needed. 1 Inhaler 11  . cholecalciferol (VITAMIN D) 1000 units tablet Take 1,000 Units by mouth daily.    Marland Kitchen conjugated estrogens (PREMARIN) vaginal cream Place 1 Applicatorful vaginally 2 (two) times a week. 42.5 g 11  . ibuprofen (ADVIL,MOTRIN) 200 MG tablet Take 1 tablet by mouth as needed.    . loteprednol (ALREX) 0.2 % SUSP as needed (for allergies)    . MAGNESIUM CARBONATE PO Take 1 capsule by mouth daily.    . metoprolol succinate (TOPROL-XL) 50 MG 24 hr tablet TAKE 1 TABLET BY MOUTH DAILY WITH OR IMMEDIATELY FOLLOWING A MEAL 30 tablet 0  . metoprolol tartrate (LOPRESSOR) 25 MG tablet Take 1 tablet by mouth as needed. cardiologist     Current Facility-Administered Medications  Medication Dose Route Frequency Provider Last Rate Last Dose  . albuterol (PROVENTIL) (2.5 MG/3ML) 0.083% nebulizer solution 2.5 mg  2.5 mg Nebulization Once Jones, Deanna C, MD      . albuterol (PROVENTIL) (2.5 MG/3ML) 0.083% nebulizer solution 2.5 mg  2.5 mg Nebulization Once Jones, Iven Finn, MD      . Ipratropium-Albuterol (COMBIVENT) respimat 1 puff  1 puff Inhalation Q6H Juline Patch, MD        Review of Systems Review of Systems  Constitutional: Negative.   Respiratory: Negative.   Cardiovascular: Negative.   Gastrointestinal: Negative for abdominal pain, constipation, diarrhea and nausea.    Blood pressure 122/78, pulse 80, temperature 97.7 F (36.5 C), temperature source Skin, resp. rate 12, height 5\' 1"  (1.549 m), weight 141 lb (64 kg), SpO2 97 %.  Physical Exam Physical Exam  Constitutional: She is oriented to  person, place, and time. She appears well-developed and well-nourished.  HENT:  Mouth/Throat: Oropharynx is clear and moist.  Eyes: Conjunctivae are normal.  Neck: Neck supple.  Cardiovascular: Normal rate, regular rhythm and normal heart sounds.  Pulmonary/Chest: Effort normal and breath sounds normal.  Abdominal: Soft. Normal appearance.  Neurological: She is alert and oriented to person, place, and time.  Skin: Skin is warm and dry.  Psychiatric: Her behavior is normal.    Data Reviewed HIDA scan with [facility] completed earlier today showed a normal ejection fraction of over 40%.  Prompt filling of the gallbladder.  Assessment    Unexplained upper abdominal pain, now resolved.  Unlikely peptic ulcer disease.  No evidence of biliary source.  Past history shingles at age 67 while pregnant with loss of infant.  Encouraged to consider shingles vaccine.    Plan    Follow up as needed. Recommend Miralax daily to help regulate bowel movements.  The patient is aware to call back for any questions, change in sympoms or new concerns.      HPI, Physical Exam, Assessment and Plan have been scribed under the direction and in the presence of Robert Bellow, MD. Karie Fetch, RN  I have completed the exam and reviewed the above documentation for accuracy and completeness.  I agree with the above.  Haematologist has been used and any errors in dictation or transcription are unintentional.  Hervey Ard, M.D., F.A.C.S.   Leslie Silva 05/16/2018, 9:32 PM

## 2018-05-16 NOTE — Patient Instructions (Addendum)
The patient is aware to call back for any questions, change in sympoms or new concerns. Recommend Miralax daily to help regulate bowel movements.

## 2018-06-24 ENCOUNTER — Other Ambulatory Visit: Payer: Self-pay | Admitting: Family Medicine

## 2018-06-24 ENCOUNTER — Other Ambulatory Visit: Payer: Self-pay | Admitting: Certified Nurse Midwife

## 2018-06-24 DIAGNOSIS — Z1231 Encounter for screening mammogram for malignant neoplasm of breast: Secondary | ICD-10-CM

## 2018-07-01 DIAGNOSIS — R69 Illness, unspecified: Secondary | ICD-10-CM | POA: Diagnosis not present

## 2018-07-10 ENCOUNTER — Ambulatory Visit
Admission: RE | Admit: 2018-07-10 | Discharge: 2018-07-10 | Disposition: A | Discharge status: Home / Self Care | Payer: Medicare HMO | Source: Ambulatory Visit | Attending: Family Medicine | Admitting: Family Medicine

## 2018-07-10 DIAGNOSIS — Z1231 Encounter for screening mammogram for malignant neoplasm of breast: Secondary | ICD-10-CM | POA: Diagnosis not present

## 2018-08-23 DIAGNOSIS — R69 Illness, unspecified: Secondary | ICD-10-CM | POA: Diagnosis not present

## 2018-09-19 DIAGNOSIS — L570 Actinic keratosis: Secondary | ICD-10-CM | POA: Diagnosis not present

## 2018-09-19 DIAGNOSIS — L821 Other seborrheic keratosis: Secondary | ICD-10-CM | POA: Diagnosis not present

## 2018-09-19 DIAGNOSIS — L814 Other melanin hyperpigmentation: Secondary | ICD-10-CM | POA: Diagnosis not present

## 2018-09-19 DIAGNOSIS — Z85840 Personal history of malignant neoplasm of eye: Secondary | ICD-10-CM | POA: Diagnosis not present

## 2018-09-19 DIAGNOSIS — L820 Inflamed seborrheic keratosis: Secondary | ICD-10-CM | POA: Diagnosis not present

## 2018-09-19 DIAGNOSIS — L723 Sebaceous cyst: Secondary | ICD-10-CM | POA: Diagnosis not present

## 2018-09-25 ENCOUNTER — Ambulatory Visit: Payer: Self-pay | Admitting: Family Medicine

## 2018-11-26 ENCOUNTER — Telehealth: Payer: Self-pay

## 2018-11-26 NOTE — Telephone Encounter (Signed)
Left message to sched appt for med refill

## 2019-01-28 DIAGNOSIS — R69 Illness, unspecified: Secondary | ICD-10-CM | POA: Diagnosis not present

## 2019-03-27 DIAGNOSIS — L570 Actinic keratosis: Secondary | ICD-10-CM | POA: Diagnosis not present

## 2019-03-27 DIAGNOSIS — L814 Other melanin hyperpigmentation: Secondary | ICD-10-CM | POA: Diagnosis not present

## 2019-03-27 DIAGNOSIS — L821 Other seborrheic keratosis: Secondary | ICD-10-CM | POA: Diagnosis not present

## 2019-03-27 DIAGNOSIS — Z85840 Personal history of malignant neoplasm of eye: Secondary | ICD-10-CM | POA: Diagnosis not present

## 2019-04-04 DIAGNOSIS — Z23 Encounter for immunization: Secondary | ICD-10-CM | POA: Diagnosis not present

## 2019-04-04 DIAGNOSIS — I10 Essential (primary) hypertension: Secondary | ICD-10-CM | POA: Diagnosis not present

## 2019-04-04 DIAGNOSIS — R0789 Other chest pain: Secondary | ICD-10-CM | POA: Diagnosis not present

## 2019-04-18 ENCOUNTER — Other Ambulatory Visit: Payer: Self-pay

## 2019-04-18 ENCOUNTER — Ambulatory Visit (INDEPENDENT_AMBULATORY_CARE_PROVIDER_SITE_OTHER): Payer: Medicare HMO | Admitting: Family Medicine

## 2019-04-18 ENCOUNTER — Encounter: Payer: Self-pay | Admitting: Family Medicine

## 2019-04-18 VITALS — BP 120/80 | HR 62 | Resp 16 | Ht 61.0 in | Wt 140.0 lb

## 2019-04-18 DIAGNOSIS — F5101 Primary insomnia: Secondary | ICD-10-CM | POA: Diagnosis not present

## 2019-04-18 DIAGNOSIS — R350 Frequency of micturition: Secondary | ICD-10-CM | POA: Diagnosis not present

## 2019-04-18 DIAGNOSIS — R69 Illness, unspecified: Secondary | ICD-10-CM | POA: Diagnosis not present

## 2019-04-18 DIAGNOSIS — N3090 Cystitis, unspecified without hematuria: Secondary | ICD-10-CM

## 2019-04-18 LAB — POCT URINALYSIS DIPSTICK
Bilirubin, UA: NEGATIVE
Glucose, UA: NEGATIVE
Ketones, UA: NEGATIVE
Nitrite, UA: POSITIVE
Protein, UA: NEGATIVE
Spec Grav, UA: 1.01 (ref 1.010–1.025)
Urobilinogen, UA: 0.2 E.U./dL
pH, UA: 6.5 (ref 5.0–8.0)

## 2019-04-18 MED ORDER — HYDROXYZINE HCL 10 MG PO TABS: 10.0000 mg | ORAL_TABLET | Freq: Every evening | ORAL | 5 refills | Status: DC | PRN

## 2019-04-18 MED ORDER — NITROFURANTOIN MONOHYD MACRO 100 MG PO CAPS: 100.0000 mg | ORAL_CAPSULE | Freq: Two times a day (BID) | ORAL | 0 refills | Status: DC

## 2019-04-18 NOTE — Progress Notes (Signed)
Date:  04/18/2019   Name:  Leslie Silva   DOB:  [birth year]   MRN:  [id number]   Chief Complaint: Urinary Frequency (2-3 days )  Urinary Frequency  This is a new problem. The current episode started in the past 7 days (Wednesday PM). The problem occurs every urination. The problem has been gradually worsening. The quality of the pain is described as burning. The pain is at a severity of 5/10. The pain is moderate. There has been no fever. Associated symptoms include frequency and urgency. Pertinent negatives include no chills, discharge, flank pain, hematuria, hesitancy, nausea, sweats or vomiting. She has tried increased fluids for the symptoms. The treatment provided no relief.    Review of Systems  Constitutional: Negative.  Negative for chills, fatigue, fever and unexpected weight change.  HENT: Negative for congestion, ear discharge, ear pain, rhinorrhea, sinus pressure, sneezing and sore throat.   Eyes: Negative for photophobia, pain, discharge, redness and itching.  Respiratory: Negative for cough, shortness of breath, wheezing and stridor.   Gastrointestinal: Negative for abdominal pain, blood in stool, constipation, diarrhea, nausea and vomiting.  Endocrine: Negative for cold intolerance, heat intolerance, polydipsia, polyphagia and polyuria.  Genitourinary: Positive for frequency and urgency. Negative for dysuria, flank pain, hematuria, hesitancy, menstrual problem, pelvic pain, vaginal bleeding and vaginal discharge.  Musculoskeletal: Negative for arthralgias, back pain and myalgias.  Skin: Negative for rash.  Allergic/Immunologic: Negative for environmental allergies and food allergies.  Neurological: Negative for dizziness, weakness, light-headedness, numbness and headaches.  Hematological: Negative for adenopathy. Does not bruise/bleed easily.  Psychiatric/Behavioral: Negative for dysphoric mood. The patient is not nervous/anxious.     Patient Active Problem List   Diagnosis Date Noted  . Abdominal pain, right upper quadrant 05/15/2018  . Acute appendicitis with perforation, localized peritonitis, and gangrene, without abscess 11/05/2017  . Appendicitis, acute 11/05/2017  . Osteoporosis 02/23/2017  . Asthma 01/13/2017  . Insomnia 01/13/2017  . Arthritis 03/12/2015  . Acid reflux 03/12/2015  . BP (high blood pressure) 03/12/2015  . Disc disorder 03/12/2015  . Cardiac conduction disorder 05/19/2014  . Basal cell carcinoma 12/24/2013  . Personal history of other malignant neoplasm of skin 12/24/2013  . Diverticulitis 08/07/2013    Allergies  Allergen Reactions  . Ciprofloxacin Anaphylaxis    "Mouth swelling up"  . Sulfa Antibiotics Swelling  . Vioxx [Rofecoxib]   . Penicillins Rash  . Tape Rash    Past Surgical History:  Procedure Laterality Date  . BACK SURGERY  2011  . BASAL CELL CARCINOMA EXCISION  12/2013  . BREAST CYST ASPIRATION Left   . CATARACT EXTRACTION Bilateral   . COLONOSCOPY  08/27/2013   cleared for 10 yrs- divert- Dr Eliberto Ivory [facility]  . FOOT NEUROMA SURGERY Left 2006  . LAPAROSCOPIC APPENDECTOMY N/A 11/05/2017   Procedure: APPENDECTOMY LAPAROSCOPIC;  Surgeon: Robert Bellow, MD;  Location: [facility] ORS;  Service: General;  Laterality: N/A;    Social History   Tobacco Use  . Smoking status: Never Smoker  . Smokeless tobacco: Never Used  Substance Use Topics  . Alcohol use: Yes    Alcohol/week: 2.0 standard drinks    Types: 2 Glasses of wine per week  . Drug use: No     Medication list has been reviewed and updated.  Current Meds  Medication Sig  . albuterol (PROVENTIL) (2.5 MG/3ML) 0.083% nebulizer solution Take 3 mLs (2.5 mg total) by nebulization every 6 (six) hours as needed for wheezing or shortness of breath.  Marland Kitchen  albuterol-ipratropium (COMBIVENT) 18-103 MCG/ACT inhaler Inhale 1 puff into the lungs as needed.  . cholecalciferol (VITAMIN D) 1000 units tablet Take 1,000 Units by mouth daily.  Marland Kitchen ibuprofen  (ADVIL,MOTRIN) 200 MG tablet Take 1 tablet by mouth as needed.  . loteprednol (ALREX) 0.2 % SUSP as needed (for allergies)  . metoprolol succinate (TOPROL-XL) 50 MG 24 hr tablet TAKE 1 TABLET BY MOUTH DAILY WITH OR IMMEDIATELY FOLLOWING A MEAL  . [DISCONTINUED] conjugated estrogens (PREMARIN) vaginal cream Place 1 Applicatorful vaginally 2 (two) times a week.  . [DISCONTINUED] MAGNESIUM CARBONATE PO Take 1 capsule by mouth daily.   Current Facility-Administered Medications for the 04/18/19 encounter (Office Visit) with Juline Patch, MD  Medication  . albuterol (PROVENTIL) (2.5 MG/3ML) 0.083% nebulizer solution 2.5 mg  . albuterol (PROVENTIL) (2.5 MG/3ML) 0.083% nebulizer solution 2.5 mg  . Ipratropium-Albuterol (COMBIVENT) respimat 1 puff    PHQ 2/9 Scores 04/18/2019 11/05/2017  PHQ - 2 Score 0 0  PHQ- 9 Score 7 -    BP Readings from Last 3 Encounters:  04/18/19 120/80  05/16/18 122/78  05/14/18 (!) 147/84    Physical Exam Constitutional:      Appearance: She is well-developed.  HENT:     Head: Normocephalic.     Right Ear: Tympanic membrane, ear canal and external ear normal.     Left Ear: Tympanic membrane, ear canal and external ear normal.  Eyes:     General: Lids are everted, no foreign bodies appreciated. No scleral icterus.       Left eye: No foreign body or hordeolum.     Conjunctiva/sclera: Conjunctivae normal.     Right eye: Right conjunctiva is not injected.     Left eye: Left conjunctiva is not injected.     Pupils: Pupils are equal, round, and reactive to light.  Neck:     Musculoskeletal: Normal range of motion and neck supple.     Thyroid: No thyromegaly.     Vascular: No JVD.     Trachea: No tracheal deviation.  Cardiovascular:     Rate and Rhythm: Normal rate and regular rhythm.     Heart sounds: Normal heart sounds. No murmur. No friction rub. No gallop.   Pulmonary:     Effort: Pulmonary effort is normal. No respiratory distress.     Breath sounds:  Normal breath sounds. No wheezing, rhonchi or rales.  Abdominal:     General: Bowel sounds are normal.     Palpations: Abdomen is soft. There is no mass.     Tenderness: There is abdominal tenderness in the suprapubic area. There is no right CVA tenderness, left CVA tenderness, guarding or rebound.  Musculoskeletal: Normal range of motion.        General: No tenderness.  Lymphadenopathy:     Cervical: No cervical adenopathy.  Skin:    General: Skin is warm.     Findings: No rash.  Neurological:     Mental Status: She is alert and oriented to person, place, and time.     Cranial Nerves: No cranial nerve deficit.     Deep Tendon Reflexes: Reflexes normal.  Psychiatric:        Mood and Affect: Mood is not anxious or depressed.     Wt Readings from Last 3 Encounters:  04/18/19 140 lb (63.5 kg)  05/16/18 141 lb (64 kg)  05/14/18 142 lb 12.8 oz (64.8 kg)    BP 120/80   Pulse 62   Resp 16  Ht 5\' 1"  (1.549 m)   Wt 140 lb (63.5 kg)   SpO2 96%   BMI 26.45 kg/m   Assessment and Plan:  1. Frequency of urination Patient with frequency for the majority of the week will obtain urinalysis. - POCT urinalysis dipstick  2. Cystitis Urinalysis is consistent with a cystitis with leukocytes nitrates.  Will initiate Macrobid 1 twice a day for 3 days. - nitrofurantoin, macrocrystal-monohydrate, (MACROBID) 100 MG capsule; Take 1 capsule (100 mg total) by mouth 2 (two) times daily.  Dispense: 6 capsule; Refill: 0  3. Primary insomnia Patient has some insomnia but is hesitant to try trazodone we will initiate Atarax/hydroxyzine 10 mg nightly. - hydrOXYzine (ATARAX/VISTARIL) 10 MG tablet; Take 1 tablet (10 mg total) by mouth at bedtime as needed.  Dispense: 30 tablet; Refill: 5

## 2019-05-10 ENCOUNTER — Other Ambulatory Visit: Payer: Self-pay | Admitting: Family Medicine

## 2019-05-10 DIAGNOSIS — F5101 Primary insomnia: Secondary | ICD-10-CM

## 2019-08-01 ENCOUNTER — Ambulatory Visit: Payer: Medicare HMO | Attending: Internal Medicine

## 2019-08-01 DIAGNOSIS — Z23 Encounter for immunization: Secondary | ICD-10-CM

## 2019-08-01 NOTE — Progress Notes (Signed)
   Covid-19 Vaccination Clinic  Name:  Leslie Silva    MRN: [id number] DOB: 02-01-1951  08/01/2019  [name] was observed post Covid-19 immunization for 15 minutes without incidence. She was provided with Vaccine Information Sheet and instruction to access the V-Safe system.   [name] was instructed to call 911 with any severe reactions post vaccine: Marland Kitchen Difficulty breathing  . Swelling of your face and throat  . A fast heartbeat  . A bad rash all over your body  . Dizziness and weakness    Immunizations Administered    Name Date Dose VIS Date Route   Moderna COVID-19 Vaccine 08/01/2019  3:13 PM 0.5 mL 05/27/2019 Intramuscular   Manufacturer: Moderna   Lot: [id number]   [id number]

## 2019-08-07 DIAGNOSIS — R69 Illness, unspecified: Secondary | ICD-10-CM | POA: Diagnosis not present

## 2019-08-12 ENCOUNTER — Other Ambulatory Visit: Payer: Self-pay | Admitting: Family Medicine

## 2019-08-12 DIAGNOSIS — Z1231 Encounter for screening mammogram for malignant neoplasm of breast: Secondary | ICD-10-CM

## 2019-09-01 ENCOUNTER — Encounter: Payer: Self-pay | Admitting: Family Medicine

## 2019-09-01 ENCOUNTER — Other Ambulatory Visit: Payer: Self-pay

## 2019-09-01 ENCOUNTER — Ambulatory Visit (INDEPENDENT_AMBULATORY_CARE_PROVIDER_SITE_OTHER): Payer: Medicare HMO | Admitting: Family Medicine

## 2019-09-01 VITALS — BP 140/70 | HR 80 | Ht 61.0 in | Wt 139.0 lb

## 2019-09-01 DIAGNOSIS — M5010 Cervical disc disorder with radiculopathy, unspecified cervical region: Secondary | ICD-10-CM

## 2019-09-01 DIAGNOSIS — Z780 Asymptomatic menopausal state: Secondary | ICD-10-CM | POA: Diagnosis not present

## 2019-09-01 DIAGNOSIS — M5412 Radiculopathy, cervical region: Secondary | ICD-10-CM

## 2019-09-01 MED ORDER — IBUPROFEN 600 MG PO TABS: 600.0000 mg | ORAL_TABLET | Freq: Three times a day (TID) | ORAL | 1 refills | Status: AC | PRN

## 2019-09-01 MED ORDER — CYCLOBENZAPRINE HCL 10 MG PO TABS: 10.0000 mg | ORAL_TABLET | Freq: Three times a day (TID) | ORAL | 0 refills | Status: AC | PRN

## 2019-09-01 MED ORDER — PREDNISONE 10 MG PO TABS: 10.0000 mg | ORAL_TABLET | Freq: Every day | ORAL | 0 refills | Status: DC

## 2019-09-01 NOTE — Progress Notes (Signed)
Date:  09/01/2019   Name:  Leslie Silva   DOB:  02-21-1951   MRN:  [id number]   Chief Complaint: Neck Pain (hurting x 5 weeks- did some stretches on a video- since then has been hurting. Was in severe pain x 1 1/2 weeks pain going down L) arm- taking ibuprofen for pain. now having pain in neck and tingling down arm and into hand.)  Neck Pain  This is a new problem. The current episode started more than 1 month ago (5 weeks ago). The problem has been gradually improving. Associated with: stretching exercises. The pain is present in the left side. The quality of the pain is described as aching and burning. The pain is at a severity of 3/10. The pain is mild. The pain is worse during the night. Associated symptoms include paresis, tingling and weakness. Pertinent negatives include no chest pain, fever, headaches, leg pain, numbness, pain with swallowing, photophobia, syncope or visual change. She has tried NSAIDs for the symptoms. The treatment provided moderate relief.    Lab Results  Component Value Date   CREATININE 0.50 05/14/2018   BUN 15 05/14/2018   NA 141 05/14/2018   K 4.1 05/14/2018   CL 103 05/14/2018   CO2 28 05/14/2018   No results found for: CHOL, HDL, LDLCALC, LDLDIRECT, TRIG, CHOLHDL No results found for: TSH No results found for: HGBA1C   Review of Systems  Constitutional: Negative.  Negative for chills, fatigue, fever and unexpected weight change.  HENT: Negative for congestion, ear discharge, ear pain, rhinorrhea, sinus pressure, sneezing and sore throat.   Eyes: Negative for photophobia, pain, discharge, redness and itching.  Respiratory: Negative for cough, shortness of breath, wheezing and stridor.   Cardiovascular: Negative for chest pain and syncope.  Gastrointestinal: Negative for abdominal pain, blood in stool, constipation, diarrhea, nausea and vomiting.  Endocrine: Negative for cold intolerance, heat intolerance, polydipsia, polyphagia and polyuria.   Genitourinary: Negative for dysuria, flank pain, frequency, hematuria, menstrual problem, pelvic pain, urgency, vaginal bleeding and vaginal discharge.  Musculoskeletal: Positive for neck pain. Negative for arthralgias, back pain and myalgias.  Skin: Negative for rash.  Allergic/Immunologic: Negative for environmental allergies and food allergies.  Neurological: Positive for tingling and weakness. Negative for dizziness, light-headedness, numbness and headaches.  Hematological: Negative for adenopathy. Does not bruise/bleed easily.  Psychiatric/Behavioral: Negative for dysphoric mood. The patient is not nervous/anxious.     Patient Active Problem List   Diagnosis Date Noted  . Abdominal pain, right upper quadrant 05/15/2018  . Acute appendicitis with perforation, localized peritonitis, and gangrene, without abscess 11/05/2017  . Appendicitis, acute 11/05/2017  . Osteoporosis 02/23/2017  . Asthma 01/13/2017  . Insomnia 01/13/2017  . Arthritis 03/12/2015  . Acid reflux 03/12/2015  . BP (high blood pressure) 03/12/2015  . Disc disorder 03/12/2015  . Cardiac conduction disorder 05/19/2014  . Basal cell carcinoma 12/24/2013  . Personal history of other malignant neoplasm of skin 12/24/2013  . Diverticulitis 08/07/2013    Allergies  Allergen Reactions  . Ciprofloxacin Anaphylaxis    "Mouth swelling up"  . Sulfa Antibiotics Swelling  . Vioxx [Rofecoxib]   . Penicillins Rash  . Tape Rash    Past Surgical History:  Procedure Laterality Date  . BACK SURGERY  2011  . BASAL CELL CARCINOMA EXCISION  12/2013  . BREAST CYST ASPIRATION Left   . CATARACT EXTRACTION Bilateral   . COLONOSCOPY  08/27/2013   cleared for 10 yrs- divert- Dr Eliberto Ivory Duke  .  FOOT NEUROMA SURGERY Left 2006  . LAPAROSCOPIC APPENDECTOMY N/A 11/05/2017   Procedure: APPENDECTOMY LAPAROSCOPIC;  Surgeon: Robert Bellow, MD;  Location: [facility] ORS;  Service: General;  Laterality: N/A;    Social History   Tobacco  Use  . Smoking status: Never Smoker  . Smokeless tobacco: Never Used  Substance Use Topics  . Alcohol use: Yes    Alcohol/week: 2.0 standard drinks    Types: 2 Glasses of wine per week  . Drug use: No     Medication list has been reviewed and updated.  Current Meds  Medication Sig  . albuterol (PROVENTIL) (2.5 MG/3ML) 0.083% nebulizer solution Take 3 mLs (2.5 mg total) by nebulization every 6 (six) hours as needed for wheezing or shortness of breath.  . cholecalciferol (VITAMIN D) 1000 units tablet Take 1,000 Units by mouth daily.  Marland Kitchen ibuprofen (ADVIL,MOTRIN) 200 MG tablet Take 1 tablet by mouth as needed.  . metoprolol succinate (TOPROL-XL) 50 MG 24 hr tablet TAKE 1 TABLET BY MOUTH DAILY WITH OR IMMEDIATELY FOLLOWING A MEAL   Current Facility-Administered Medications for the 09/01/19 encounter (Office Visit) with Juline Patch, MD  Medication  . albuterol (PROVENTIL) (2.5 MG/3ML) 0.083% nebulizer solution 2.5 mg  . albuterol (PROVENTIL) (2.5 MG/3ML) 0.083% nebulizer solution 2.5 mg  . Ipratropium-Albuterol (COMBIVENT) respimat 1 puff    PHQ 2/9 Scores 04/18/2019 11/05/2017  PHQ - 2 Score 0 0  PHQ- 9 Score 7 -    BP Readings from Last 3 Encounters:  09/01/19 140/70  04/18/19 120/80  05/16/18 122/78    Physical Exam Constitutional:      General: She is not in acute distress.    Appearance: She is not diaphoretic.  HENT:     Head: Normocephalic and atraumatic.     Right Ear: Tympanic membrane, ear canal and external ear normal. There is no impacted cerumen.     Left Ear: Tympanic membrane, ear canal and external ear normal. There is no impacted cerumen.     Nose: Nose normal. No congestion or rhinorrhea.  Eyes:     General:        Right eye: No discharge.        Left eye: No discharge.     Conjunctiva/sclera: Conjunctivae normal.     Pupils: Pupils are equal, round, and reactive to light.  Neck:     Thyroid: No thyromegaly.     Vascular: No JVD.  Cardiovascular:      Rate and Rhythm: Normal rate and regular rhythm.     Pulses: Normal pulses.     Heart sounds: Normal heart sounds. No murmur. No friction rub. No gallop.   Pulmonary:     Effort: Pulmonary effort is normal.     Breath sounds: Normal breath sounds. No wheezing, rhonchi or rales.  Abdominal:     General: Bowel sounds are normal.     Palpations: Abdomen is soft. There is no mass.     Tenderness: There is no abdominal tenderness. There is no guarding.  Genitourinary:    General: Normal vulva.     Rectum: Normal.  Musculoskeletal:        General: Normal range of motion.     Cervical back: Normal range of motion and neck supple.  Lymphadenopathy:     Cervical: No cervical adenopathy.  Skin:    General: Skin is warm and dry.     Findings: No bruising or erythema.  Neurological:     Mental Status: She is alert.  Cranial Nerves: No cranial nerve deficit.     Sensory: Sensory deficit present.     Deep Tendon Reflexes: Reflexes are normal and symmetric.     Comments: Sensory deficit left ulnar/median     Wt Readings from Last 3 Encounters:  09/01/19 139 lb (63 kg)  04/18/19 140 lb (63.5 kg)  05/16/18 141 lb (64 kg)    BP 140/70   Pulse 80   Ht 5\' 1"  (1.549 m)   Wt 139 lb (63 kg)   BMI 26.26 kg/m   Assessment and Plan:  1. Cervical disc disorder with radiculopathy Patient was doing some exercising and stretches that involved what sounds like a hyperextension of her neck afterwards she developed extreme pain that went to the left shoulder and down the ulnar median distribution to the left hand.  Over the course of taken ibuprofen patient gradually improved to the point that it is in the 2-3 range as far as pain.  There is no residual weakness on exam and sensory is intact bilateral.  Patient will be put on prednisone 10 mg 2 tablets for a week followed by 1 tablet for 1 week.  Was also given cyclobenzaprine to take at night.  And patient's ibuprofen was consolidated to a 600 mg  tablet 3 times a day for anti-inflammatory effect as well.  Patient was referred to orthopedics for evaluation and possible need of MRI if symptoms have continued.  Patient has seen for her lower back to Dr. March Rummage neurosurgeon who is currently with [facility] and if surgery is necessary is interested in returning to his surgical care. - Ambulatory referral to Orthopedic Surgery  2. Cervical radiculopathy Pain radiates to the ulnar medial distribution of the left hand.  And has been persistent since stretching injury.  Will refer to [facility] Monday orthopedic surgery for initial evaluation and possible need for MRI. - Ambulatory referral to Orthopedic Surgery  3. Menopause Patient needs DEXA scan for menopause.  Patient currently has a breast exam upcoming and would like to have it on the same day for 4/ 21/21. - DG Bone Density; Future  4.  Breast cancer screening.  Patient had a manual breast exam which noted no palpable masses or axillary adenopathy.  It was reviewed and patient will continue with mammogram which has been scheduled for 10/15/2019.

## 2019-09-02 ENCOUNTER — Ambulatory Visit: Payer: Medicare HMO | Attending: Internal Medicine

## 2019-09-02 DIAGNOSIS — Z23 Encounter for immunization: Secondary | ICD-10-CM | POA: Insufficient documentation

## 2019-09-02 NOTE — Progress Notes (Signed)
   Covid-19 Vaccination Clinic  Name:  Tenasha Egle    MRN: [id number] DOB: Nov 09, 1950  09/02/2019  Ms. Lambeth was observed post Covid-19 immunization for 15 minutes without incident. She was provided with Vaccine Information Sheet and instruction to access the V-Safe system.   Ms. Sweat was instructed to call 911 with any severe reactions post vaccine: Marland Kitchen Difficulty breathing  . Swelling of face and throat  . A fast heartbeat  . A bad rash all over body  . Dizziness and weakness   Immunizations Administered    Name Date Dose VIS Date Route   Moderna COVID-19 Vaccine 09/02/2019  2:43 PM 0.5 mL 05/27/2019 Intramuscular   Manufacturer: Moderna   Lot: [id number]   [id number]

## 2019-09-09 DIAGNOSIS — M25512 Pain in left shoulder: Secondary | ICD-10-CM | POA: Diagnosis not present

## 2019-09-09 DIAGNOSIS — M5012 Mid-cervical disc disorder, unspecified level: Secondary | ICD-10-CM | POA: Diagnosis not present

## 2019-09-09 DIAGNOSIS — M542 Cervicalgia: Secondary | ICD-10-CM | POA: Diagnosis not present

## 2019-09-24 ENCOUNTER — Other Ambulatory Visit: Payer: Self-pay | Admitting: Family Medicine

## 2019-09-24 DIAGNOSIS — H10013 Acute follicular conjunctivitis, bilateral: Secondary | ICD-10-CM | POA: Diagnosis not present

## 2019-09-25 DIAGNOSIS — L821 Other seborrheic keratosis: Secondary | ICD-10-CM | POA: Diagnosis not present

## 2019-09-25 DIAGNOSIS — L570 Actinic keratosis: Secondary | ICD-10-CM | POA: Diagnosis not present

## 2019-09-25 DIAGNOSIS — C4491 Basal cell carcinoma of skin, unspecified: Secondary | ICD-10-CM | POA: Diagnosis not present

## 2019-09-25 DIAGNOSIS — L739 Follicular disorder, unspecified: Secondary | ICD-10-CM | POA: Diagnosis not present

## 2019-09-25 DIAGNOSIS — L814 Other melanin hyperpigmentation: Secondary | ICD-10-CM | POA: Diagnosis not present

## 2019-10-07 ENCOUNTER — Emergency Department: Payer: Medicare HMO

## 2019-10-07 ENCOUNTER — Encounter: Payer: Self-pay | Admitting: Emergency Medicine

## 2019-10-07 ENCOUNTER — Emergency Department
Admission: EM | Admit: 2019-10-07 | Discharge: 2019-10-07 | Disposition: A | Discharge status: Home / Self Care | Payer: Medicare HMO | Attending: Emergency Medicine | Admitting: Emergency Medicine

## 2019-10-07 ENCOUNTER — Other Ambulatory Visit: Payer: Self-pay

## 2019-10-07 DIAGNOSIS — S161XXA Strain of muscle, fascia and tendon at neck level, initial encounter: Secondary | ICD-10-CM | POA: Diagnosis not present

## 2019-10-07 DIAGNOSIS — S9031XA Contusion of right foot, initial encounter: Secondary | ICD-10-CM | POA: Diagnosis not present

## 2019-10-07 DIAGNOSIS — J45909 Unspecified asthma, uncomplicated: Secondary | ICD-10-CM | POA: Insufficient documentation

## 2019-10-07 DIAGNOSIS — Z79899 Other long term (current) drug therapy: Secondary | ICD-10-CM | POA: Diagnosis not present

## 2019-10-07 DIAGNOSIS — R609 Edema, unspecified: Secondary | ICD-10-CM | POA: Diagnosis not present

## 2019-10-07 DIAGNOSIS — M7918 Myalgia, other site: Secondary | ICD-10-CM

## 2019-10-07 DIAGNOSIS — G4489 Other headache syndrome: Secondary | ICD-10-CM | POA: Diagnosis not present

## 2019-10-07 DIAGNOSIS — S99911A Unspecified injury of right ankle, initial encounter: Secondary | ICD-10-CM | POA: Diagnosis not present

## 2019-10-07 DIAGNOSIS — R0602 Shortness of breath: Secondary | ICD-10-CM | POA: Diagnosis not present

## 2019-10-07 DIAGNOSIS — Y92410 Unspecified street and highway as the place of occurrence of the external cause: Secondary | ICD-10-CM | POA: Diagnosis not present

## 2019-10-07 DIAGNOSIS — I10 Essential (primary) hypertension: Secondary | ICD-10-CM | POA: Insufficient documentation

## 2019-10-07 DIAGNOSIS — Y939 Activity, unspecified: Secondary | ICD-10-CM | POA: Insufficient documentation

## 2019-10-07 DIAGNOSIS — Y999 Unspecified external cause status: Secondary | ICD-10-CM | POA: Insufficient documentation

## 2019-10-07 DIAGNOSIS — S199XXA Unspecified injury of neck, initial encounter: Secondary | ICD-10-CM | POA: Diagnosis not present

## 2019-10-07 DIAGNOSIS — R519 Headache, unspecified: Secondary | ICD-10-CM | POA: Diagnosis not present

## 2019-10-07 DIAGNOSIS — S0990XA Unspecified injury of head, initial encounter: Secondary | ICD-10-CM

## 2019-10-07 DIAGNOSIS — R52 Pain, unspecified: Secondary | ICD-10-CM | POA: Diagnosis not present

## 2019-10-07 MED ORDER — TRAMADOL HCL 50 MG PO TABS: 50.0000 mg | ORAL_TABLET | Freq: Four times a day (QID) | ORAL | 0 refills | Status: AC | PRN

## 2019-10-07 MED ORDER — CYCLOBENZAPRINE HCL 10 MG PO TABS: 10.0000 mg | ORAL_TABLET | Freq: Three times a day (TID) | ORAL | 0 refills | Status: DC | PRN

## 2019-10-07 MED ORDER — IBUPROFEN 600 MG PO TABS
600.0000 mg | ORAL_TABLET | Freq: Once | ORAL | Status: AC
  Administered 2019-10-07: 600 mg via ORAL
  Filled 2019-10-07: qty 1

## 2019-10-07 MED ORDER — IBUPROFEN 600 MG PO TABS: 600.0000 mg | ORAL_TABLET | Freq: Three times a day (TID) | ORAL | 0 refills | Status: DC | PRN

## 2019-10-07 MED ORDER — CYCLOBENZAPRINE HCL 10 MG PO TABS
10.0000 mg | ORAL_TABLET | Freq: Once | ORAL | Status: AC
  Administered 2019-10-07: 10 mg via ORAL
  Filled 2019-10-07: qty 1
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
## 2019-10-07 NOTE — Discharge Instructions (Signed)
Your CT and x-rays were unremarkable.  Follow discharge care instructions take medications as directed.

## 2019-10-15 ENCOUNTER — Ambulatory Visit
Admission: RE | Admit: 2019-10-15 | Discharge: 2019-10-15 | Disposition: A | Discharge status: Home / Self Care | Payer: Medicare HMO | Source: Ambulatory Visit | Attending: Family Medicine | Admitting: Family Medicine

## 2019-10-15 DIAGNOSIS — Z780 Asymptomatic menopausal state: Secondary | ICD-10-CM

## 2019-10-15 DIAGNOSIS — M810 Age-related osteoporosis without current pathological fracture: Secondary | ICD-10-CM | POA: Diagnosis not present

## 2019-10-15 DIAGNOSIS — Z1231 Encounter for screening mammogram for malignant neoplasm of breast: Secondary | ICD-10-CM | POA: Diagnosis not present

## 2019-10-15 HISTORY — DX: Malignant (primary) neoplasm, unspecified: C80.1

## 2019-10-16 DIAGNOSIS — G44309 Post-traumatic headache, unspecified, not intractable: Secondary | ICD-10-CM | POA: Diagnosis not present

## 2019-10-16 DIAGNOSIS — R519 Headache, unspecified: Secondary | ICD-10-CM | POA: Diagnosis not present

## 2019-10-31 ENCOUNTER — Ambulatory Visit (INDEPENDENT_AMBULATORY_CARE_PROVIDER_SITE_OTHER): Payer: Medicare HMO | Admitting: Family Medicine

## 2019-10-31 ENCOUNTER — Other Ambulatory Visit: Payer: Self-pay

## 2019-10-31 ENCOUNTER — Encounter: Payer: Self-pay | Admitting: Family Medicine

## 2019-10-31 VITALS — BP 128/80 | HR 64 | Ht 61.0 in | Wt 141.0 lb

## 2019-10-31 DIAGNOSIS — J0100 Acute maxillary sinusitis, unspecified: Secondary | ICD-10-CM | POA: Diagnosis not present

## 2019-10-31 MED ORDER — AZITHROMYCIN 250 MG PO TABS: ORAL_TABLET | ORAL | 0 refills | Status: DC

## 2019-10-31 NOTE — Progress Notes (Signed)
Date:  10/31/2019   Name:  Leslie Silva   DOB:  Jan 27, 1951   MRN:  [id number]   Chief Complaint: Sore Throat (bloody drainage in the am- L) side of throat is sore/ hurts to swallow. Using otc spray and advil. No fever.)  Sore Throat  This is a new problem. The current episode started in the past 7 days. The problem has been waxing and waning. The pain is worse on the right side. The pain is at a severity of 4/10. Associated symptoms include congestion. Pertinent negatives include no abdominal pain, coughing, diarrhea, ear discharge, ear pain, headaches, hoarse voice, neck pain, shortness of breath, stridor, swollen glands or vomiting.  Sinusitis This is a new problem. The problem has been waxing and waning since onset. There has been no fever. The pain is mild. Associated symptoms include congestion. Pertinent negatives include no chills, coughing, diaphoresis, ear pain, headaches, hoarse voice, neck pain, shortness of breath, sinus pressure, sneezing, sore throat or swollen glands.    Lab Results  Component Value Date   CREATININE 0.50 05/14/2018   BUN 15 05/14/2018   NA 141 05/14/2018   K 4.1 05/14/2018   CL 103 05/14/2018   CO2 28 05/14/2018   No results found for: CHOL, HDL, LDLCALC, LDLDIRECT, TRIG, CHOLHDL No results found for: TSH No results found for: HGBA1C Lab Results  Component Value Date   WBC 5.5 05/14/2018   HGB 13.9 05/14/2018   HCT 40.9 05/14/2018   MCV 96.0 05/14/2018   PLT 220 05/14/2018   Lab Results  Component Value Date   ALT 31 05/14/2018   AST 26 05/14/2018   ALKPHOS 84 05/14/2018   BILITOT 0.9 05/14/2018     Review of Systems  Constitutional: Negative.  Negative for chills, diaphoresis, fatigue, fever and unexpected weight change.  HENT: Positive for congestion. Negative for ear discharge, ear pain, hoarse voice, rhinorrhea, sinus pressure, sneezing and sore throat.   Eyes: Negative for photophobia, pain, discharge, redness and itching.   Respiratory: Negative for cough, shortness of breath, wheezing and stridor.   Gastrointestinal: Negative for abdominal pain, blood in stool, constipation, diarrhea, nausea and vomiting.  Endocrine: Negative for cold intolerance, heat intolerance, polydipsia, polyphagia and polyuria.  Genitourinary: Negative for dysuria, flank pain, frequency, hematuria, menstrual problem, pelvic pain, urgency, vaginal bleeding and vaginal discharge.  Musculoskeletal: Negative for arthralgias, back pain, myalgias and neck pain.  Skin: Negative for rash.  Allergic/Immunologic: Negative for environmental allergies and food allergies.  Neurological: Negative for dizziness, weakness, light-headedness, numbness and headaches.  Hematological: Negative for adenopathy. Does not bruise/bleed easily.  Psychiatric/Behavioral: Negative for dysphoric mood. The patient is not nervous/anxious.     Patient Active Problem List   Diagnosis Date Noted  . Abdominal pain, right upper quadrant 05/15/2018  . Acute appendicitis with perforation, localized peritonitis, and gangrene, without abscess 11/05/2017  . Appendicitis, acute 11/05/2017  . Osteoporosis 02/23/2017  . Asthma 01/13/2017  . Insomnia 01/13/2017  . Arthritis 03/12/2015  . Acid reflux 03/12/2015  . BP (high blood pressure) 03/12/2015  . Disc disorder 03/12/2015  . Cardiac conduction disorder 05/19/2014  . Basal cell carcinoma 12/24/2013  . Personal history of other malignant neoplasm of skin 12/24/2013  . Diverticulitis 08/07/2013    Allergies  Allergen Reactions  . Ciprofloxacin Anaphylaxis    "Mouth swelling up"  . Sulfa Antibiotics Swelling  . Vioxx [Rofecoxib]   . Penicillins Rash  . Tape Rash    Past Surgical History:  Procedure  Laterality Date  . APPENDECTOMY    . BACK SURGERY  2011  . BASAL CELL CARCINOMA EXCISION  12/2013  . BREAST CYST ASPIRATION Left   . CATARACT EXTRACTION Bilateral   . COLONOSCOPY  08/27/2013   cleared for 10 yrs-  divert- Dr Eliberto Ivory Duke  . FOOT NEUROMA SURGERY Left 2006  . LAPAROSCOPIC APPENDECTOMY N/A 11/05/2017   Procedure: APPENDECTOMY LAPAROSCOPIC;  Surgeon: Robert Bellow, MD;  Location: [facility] ORS;  Service: General;  Laterality: N/A;    Social History   Tobacco Use  . Smoking status: Never Smoker  . Smokeless tobacco: Never Used  Substance Use Topics  . Alcohol use: Yes    Alcohol/week: 2.0 standard drinks    Types: 2 Glasses of wine per week  . Drug use: No     Medication list has been reviewed and updated.  Current Meds  Medication Sig  . albuterol (PROVENTIL) (2.5 MG/3ML) 0.083% nebulizer solution Take 3 mLs (2.5 mg total) by nebulization every 6 (six) hours as needed for wheezing or shortness of breath.  . cholecalciferol (VITAMIN D) 1000 units tablet Take 1,000 Units by mouth daily.  Marland Kitchen ibuprofen (ADVIL) 600 MG tablet Take 1 tablet (600 mg total) by mouth every 8 (eight) hours as needed.  . metoprolol succinate (TOPROL-XL) 50 MG 24 hr tablet TAKE 1 TABLET BY MOUTH DAILY WITH OR IMMEDIATELY FOLLOWING A MEAL  . metoprolol tartrate (LOPRESSOR) 25 MG tablet Take 1 tablet by mouth as needed. cardiologist   Current Facility-Administered Medications for the 10/31/19 encounter (Office Visit) with Juline Patch, MD  Medication  . albuterol (PROVENTIL) (2.5 MG/3ML) 0.083% nebulizer solution 2.5 mg  . albuterol (PROVENTIL) (2.5 MG/3ML) 0.083% nebulizer solution 2.5 mg  . Ipratropium-Albuterol (COMBIVENT) respimat 1 puff    PHQ 2/9 Scores 10/31/2019 04/18/2019 11/05/2017  PHQ - 2 Score 0 0 0  PHQ- 9 Score 0 7 -    BP Readings from Last 3 Encounters:  10/31/19 128/80  10/07/19 140/68  09/01/19 140/70    Physical Exam Vitals and nursing note reviewed.  Constitutional:      Appearance: She is well-developed.  HENT:     Head: Normocephalic.     Jaw: There is normal jaw occlusion.     Right Ear: Hearing, tympanic membrane, ear canal and external ear normal.     Left Ear: Hearing,  tympanic membrane, ear canal and external ear normal.     Nose:     Right Turbinates: Not enlarged.     Left Turbinates: Not enlarged.     Right Sinus: No maxillary sinus tenderness or frontal sinus tenderness.     Left Sinus: Maxillary sinus tenderness present. No frontal sinus tenderness.  Eyes:     General: Lids are everted, no foreign bodies appreciated. No scleral icterus.       Left eye: No foreign body or hordeolum.     Conjunctiva/sclera: Conjunctivae normal.     Right eye: Right conjunctiva is not injected.     Left eye: Left conjunctiva is not injected.     Pupils: Pupils are equal, round, and reactive to light.  Neck:     Thyroid: No thyromegaly.     Vascular: No JVD.     Trachea: No tracheal deviation.  Cardiovascular:     Rate and Rhythm: Normal rate and regular rhythm.     Heart sounds: Normal heart sounds. No murmur. No friction rub. No gallop.   Pulmonary:     Effort: Pulmonary effort is  normal. No respiratory distress.     Breath sounds: Normal breath sounds. No wheezing or rales.  Abdominal:     General: Bowel sounds are normal.     Palpations: Abdomen is soft. There is no mass.     Tenderness: There is no abdominal tenderness. There is no guarding or rebound.  Musculoskeletal:        General: No tenderness. Normal range of motion.     Cervical back: Normal range of motion and neck supple.  Lymphadenopathy:     Head:     Right side of head: No submental or submandibular adenopathy.     Left side of head: No submental or submandibular adenopathy.     Cervical: No cervical adenopathy.     Right cervical: No superficial, deep or posterior cervical adenopathy.    Left cervical: No superficial, deep or posterior cervical adenopathy.  Skin:    General: Skin is warm.     Findings: No rash.  Neurological:     Mental Status: She is alert and oriented to person, place, and time.     Cranial Nerves: No cranial nerve deficit.     Deep Tendon Reflexes: Reflexes normal.   Psychiatric:        Mood and Affect: Mood is not anxious or depressed.     Wt Readings from Last 3 Encounters:  10/31/19 141 lb (64 kg)  10/07/19 136 lb 0.4 oz (61.7 kg)  09/01/19 139 lb (63 kg)    BP 128/80   Pulse 64   Ht 5\' 1"  (1.549 m)   Wt 141 lb (64 kg)   BMI 26.64 kg/m   Assessment and Plan:   1. Acute non-recurrent maxillary sinusitis Acute.  Persistent.  Uncontrolled.  Patient continues have discomfort and nasal drainage with purulent nature status post sleeping at night.  In the past patient has tolerated and responded to azithromycin and this will be prescribed to 50 mg beginning 2 today followed by 1 a day for 4 days. - azithromycin (ZITHROMAX) 250 MG tablet; 2 today then 1 a day for 4 days  Dispense: 6 tablet; Refill: 0

## 2019-10-31 NOTE — Patient Instructions (Signed)

## 2019-11-11 DIAGNOSIS — M5012 Mid-cervical disc disorder, unspecified level: Secondary | ICD-10-CM | POA: Diagnosis not present

## 2019-11-11 DIAGNOSIS — M5412 Radiculopathy, cervical region: Secondary | ICD-10-CM | POA: Diagnosis not present

## 2019-11-12 DIAGNOSIS — I471 Supraventricular tachycardia: Secondary | ICD-10-CM | POA: Diagnosis not present

## 2019-11-12 DIAGNOSIS — Z85828 Personal history of other malignant neoplasm of skin: Secondary | ICD-10-CM | POA: Diagnosis not present

## 2019-11-12 DIAGNOSIS — Z882 Allergy status to sulfonamides status: Secondary | ICD-10-CM | POA: Diagnosis not present

## 2019-11-12 DIAGNOSIS — Z008 Encounter for other general examination: Secondary | ICD-10-CM | POA: Diagnosis not present

## 2019-11-12 DIAGNOSIS — Z809 Family history of malignant neoplasm, unspecified: Secondary | ICD-10-CM | POA: Diagnosis not present

## 2019-11-12 DIAGNOSIS — R030 Elevated blood-pressure reading, without diagnosis of hypertension: Secondary | ICD-10-CM | POA: Diagnosis not present

## 2019-11-12 DIAGNOSIS — G8929 Other chronic pain: Secondary | ICD-10-CM | POA: Diagnosis not present

## 2019-11-12 DIAGNOSIS — J45909 Unspecified asthma, uncomplicated: Secondary | ICD-10-CM | POA: Diagnosis not present

## 2019-11-12 DIAGNOSIS — Z880 Allergy status to penicillin: Secondary | ICD-10-CM | POA: Diagnosis not present

## 2019-11-17 ENCOUNTER — Telehealth: Payer: Self-pay | Admitting: Family Medicine

## 2019-11-17 ENCOUNTER — Other Ambulatory Visit: Payer: Self-pay

## 2019-11-17 DIAGNOSIS — B379 Candidiasis, unspecified: Secondary | ICD-10-CM

## 2019-11-17 MED ORDER — FLUCONAZOLE 150 MG PO TABS: 150.0000 mg | ORAL_TABLET | Freq: Once | ORAL | 0 refills | Status: AC

## 2019-11-17 NOTE — Telephone Encounter (Unsigned)
Copied from [place] [id number]. Topic: General - Other >> Nov 17, 2019 11:24 AM Oneta Rack wrote: Patient requesting PCP nurse to return call, patient declined to specify the message

## 2019-11-17 NOTE — Telephone Encounter (Signed)
Pt needed diflucan for yeast after antibiotic- sent in to [facility]

## 2019-11-17 NOTE — Progress Notes (Unsigned)
Sent in diflucan to [place]

## 2019-12-04 DIAGNOSIS — M5412 Radiculopathy, cervical region: Secondary | ICD-10-CM | POA: Diagnosis not present

## 2019-12-04 DIAGNOSIS — M542 Cervicalgia: Secondary | ICD-10-CM | POA: Diagnosis not present

## 2019-12-19 DIAGNOSIS — M542 Cervicalgia: Secondary | ICD-10-CM | POA: Diagnosis not present

## 2019-12-19 DIAGNOSIS — M5412 Radiculopathy, cervical region: Secondary | ICD-10-CM | POA: Diagnosis not present

## 2019-12-22 DIAGNOSIS — M5412 Radiculopathy, cervical region: Secondary | ICD-10-CM | POA: Diagnosis not present

## 2019-12-22 DIAGNOSIS — M542 Cervicalgia: Secondary | ICD-10-CM | POA: Diagnosis not present

## 2019-12-26 DIAGNOSIS — M542 Cervicalgia: Secondary | ICD-10-CM | POA: Diagnosis not present

## 2019-12-26 DIAGNOSIS — M5412 Radiculopathy, cervical region: Secondary | ICD-10-CM | POA: Diagnosis not present

## 2020-01-02 DIAGNOSIS — M5412 Radiculopathy, cervical region: Secondary | ICD-10-CM | POA: Diagnosis not present

## 2020-01-02 DIAGNOSIS — M542 Cervicalgia: Secondary | ICD-10-CM | POA: Diagnosis not present

## 2020-01-09 DIAGNOSIS — M5412 Radiculopathy, cervical region: Secondary | ICD-10-CM | POA: Diagnosis not present

## 2020-01-09 DIAGNOSIS — M542 Cervicalgia: Secondary | ICD-10-CM | POA: Diagnosis not present

## 2020-01-13 DIAGNOSIS — M5412 Radiculopathy, cervical region: Secondary | ICD-10-CM | POA: Diagnosis not present

## 2020-01-13 DIAGNOSIS — M542 Cervicalgia: Secondary | ICD-10-CM | POA: Diagnosis not present

## 2020-01-16 DIAGNOSIS — M542 Cervicalgia: Secondary | ICD-10-CM | POA: Diagnosis not present

## 2020-01-16 DIAGNOSIS — M5412 Radiculopathy, cervical region: Secondary | ICD-10-CM | POA: Diagnosis not present

## 2020-01-19 DIAGNOSIS — M542 Cervicalgia: Secondary | ICD-10-CM | POA: Diagnosis not present

## 2020-01-19 DIAGNOSIS — M5412 Radiculopathy, cervical region: Secondary | ICD-10-CM | POA: Diagnosis not present

## 2020-01-23 DIAGNOSIS — M5412 Radiculopathy, cervical region: Secondary | ICD-10-CM | POA: Diagnosis not present

## 2020-01-23 DIAGNOSIS — M542 Cervicalgia: Secondary | ICD-10-CM | POA: Diagnosis not present

## 2020-01-29 DIAGNOSIS — M542 Cervicalgia: Secondary | ICD-10-CM | POA: Diagnosis not present

## 2020-01-29 DIAGNOSIS — M5412 Radiculopathy, cervical region: Secondary | ICD-10-CM | POA: Diagnosis not present

## 2020-02-12 DIAGNOSIS — M5412 Radiculopathy, cervical region: Secondary | ICD-10-CM | POA: Diagnosis not present

## 2020-02-12 DIAGNOSIS — M542 Cervicalgia: Secondary | ICD-10-CM | POA: Diagnosis not present

## 2020-02-24 DIAGNOSIS — R69 Illness, unspecified: Secondary | ICD-10-CM | POA: Diagnosis not present

## 2020-03-08 ENCOUNTER — Telehealth: Payer: Self-pay | Admitting: Family Medicine

## 2020-03-08 ENCOUNTER — Other Ambulatory Visit: Payer: Self-pay

## 2020-03-08 DIAGNOSIS — J4521 Mild intermittent asthma with (acute) exacerbation: Secondary | ICD-10-CM

## 2020-03-08 DIAGNOSIS — R69 Illness, unspecified: Secondary | ICD-10-CM | POA: Diagnosis not present

## 2020-03-08 DIAGNOSIS — Z20822 Contact with and (suspected) exposure to covid-19: Secondary | ICD-10-CM | POA: Diagnosis not present

## 2020-03-08 MED ORDER — ALBUTEROL SULFATE (2.5 MG/3ML) 0.083% IN NEBU: 2.5000 mg | INHALATION_SOLUTION | Freq: Four times a day (QID) | RESPIRATORY_TRACT | 1 refills | Status: AC | PRN

## 2020-03-08 NOTE — Progress Notes (Unsigned)
Sent in nebulizer solution.

## 2020-03-08 NOTE — Telephone Encounter (Signed)
Pt advised to go to [facility] for covid testing. She said she has an appt with them today at 100. We will follow up with her after her test comes back

## 2020-03-08 NOTE — Telephone Encounter (Unsigned)
Copied from [place] [id number]. Topic: Quick Communication - See Telephone Encounter >> Mar 08, 2020  8:48 AM Loma Boston wrote: [phone] [facility] Fu with pt covid symptoms since Thur ,upper respiratory issues, cough, feels warm to touch wanting to get cleared to come into office / fail DT

## 2020-03-09 ENCOUNTER — Encounter: Payer: Self-pay | Admitting: Family Medicine

## 2020-03-10 ENCOUNTER — Ambulatory Visit
Admission: RE | Admit: 2020-03-10 | Discharge: 2020-03-10 | Disposition: A | Discharge status: Home / Self Care | Payer: Medicare HMO | Source: Ambulatory Visit | Attending: Emergency Medicine | Admitting: Emergency Medicine

## 2020-03-10 ENCOUNTER — Other Ambulatory Visit: Payer: Self-pay

## 2020-03-10 VITALS — BP 133/80 | HR 95 | Temp 100.4°F | Resp 16

## 2020-03-10 DIAGNOSIS — J0100 Acute maxillary sinusitis, unspecified: Secondary | ICD-10-CM | POA: Diagnosis not present

## 2020-03-10 MED ORDER — AZITHROMYCIN 250 MG PO TABS: 250.0000 mg | ORAL_TABLET | Freq: Every day | ORAL | 0 refills | Status: DC

## 2020-03-10 NOTE — Discharge Instructions (Addendum)
Take the antibiotic as directed.  Follow up with your primary care provider if your symptoms are not improving.     

## 2020-03-10 NOTE — ED Provider Notes (Signed)
[name]    CSN: [id number] Arrival date & time: 03/10/20  1351      History   Chief Complaint Chief Complaint  Patient presents with  . Cough  . Sinus Problem    HPI Leslie Silva is a 69 y.o. female.  Patient presents with sinus congestion, sinus pressure, cough productive of yellow phlegm, postnasal drip, scratchy throat x 8 days. She reports a low-grade fever since yesterday. T-max 99 at home. She denies rash, shortness of breath, vomiting, diarrhea, or other symptoms. Patient had a negative Covid test on 03/08/2020. Patient's medical history includes hypertension, asthma, mitral valve prolapse, diverticulosis, arthritis, disc pression, shingles, skin cancer, insomnia.  The history is provided by the patient.    Past Medical History:  Diagnosis Date  . Arthritis   . Asthma   . Cancer ([place])    skin  . Depression   . Diverticulosis   . Hypertension   . Mitral valve prolapse   . Shingles    age 84     Patient Active Problem List   Diagnosis Date Noted  . Abdominal pain, right upper quadrant 05/15/2018  . Acute appendicitis with perforation, localized peritonitis, and gangrene, without abscess 11/05/2017  . Appendicitis, acute 11/05/2017  . Osteoporosis 02/23/2017  . Asthma 01/13/2017  . Insomnia 01/13/2017  . Arthritis 03/12/2015  . Acid reflux 03/12/2015  . BP (high blood pressure) 03/12/2015  . Disc disorder 03/12/2015  . Cardiac conduction disorder 05/19/2014  . Basal cell carcinoma 12/24/2013  . Personal history of other malignant neoplasm of skin 12/24/2013  . Diverticulitis 08/07/2013    Past Surgical History:  Procedure Laterality Date  . APPENDECTOMY    . BACK SURGERY  2011  . BASAL CELL CARCINOMA EXCISION  12/2013  . BREAST CYST ASPIRATION Left   . CATARACT EXTRACTION Bilateral   . COLONOSCOPY  08/27/2013   cleared for 10 yrs- divert- Dr Eliberto Ivory Duke  . FOOT NEUROMA SURGERY Left 2006  . LAPAROSCOPIC APPENDECTOMY N/A 11/05/2017    Procedure: APPENDECTOMY LAPAROSCOPIC;  Surgeon: Robert Bellow, MD;  Location: [facility] ORS;  Service: General;  Laterality: N/A;    OB History    Gravida  3   Para  2   Term  2   Preterm      AB  1   Living  2     SAB  1   TAB      Ectopic      Multiple      Live Births  2            Home Medications    Prior to Admission medications   Medication Sig Start Date End Date Taking? Authorizing Provider  albuterol (PROVENTIL) (2.5 MG/3ML) 0.083% nebulizer solution Take 3 mLs (2.5 mg total) by nebulization every 6 (six) hours as needed for wheezing or shortness of breath. 03/08/20   Juline Patch, MD  azithromycin (ZITHROMAX) 250 MG tablet Take 1 tablet (250 mg total) by mouth daily. Take first 2 tablets together, then 1 every day until finished. 03/10/20   Sharion Balloon, NP  cholecalciferol (VITAMIN D) 1000 units tablet Take 1,000 Units by mouth daily.    [provider]  cyclobenzaprine (FLEXERIL) 10 MG tablet Take 1 tablet (10 mg total) by mouth 3 (three) times daily as needed for muscle spasms. Patient not taking: Reported on 10/31/2019 09/01/19   Juline Patch, MD  ibuprofen (ADVIL) 600 MG tablet Take 1 tablet (600 mg  total) by mouth every 8 (eight) hours as needed. 09/01/19   Juline Patch, MD  loteprednol (ALREX) 0.2 % SUSP as needed (for allergies) 03/20/14   [provider]  metoprolol succinate (TOPROL-XL) 50 MG 24 hr tablet TAKE 1 TABLET BY MOUTH DAILY WITH OR IMMEDIATELY FOLLOWING A MEAL 09/26/16   Juline Patch, MD  metoprolol tartrate (LOPRESSOR) 25 MG tablet Take 1 tablet by mouth as needed. cardiologist 03/03/16 10/31/19  [provider]  traMADol (ULTRAM) 50 MG tablet Take 1 tablet (50 mg total) by mouth every 6 (six) hours as needed for moderate pain. Patient not taking: Reported on 10/31/2019 10/07/19   Sable Feil, PA-C    Family History Family History  Problem Relation Age of Onset  . Cancer Mother   . Liver cancer Mother 5   . Heart disease Father   . Asthma Brother   . Liver cancer Brother 66  . Heart disease Maternal Aunt        aortic valve replacement  . Heart disease Cousin        aortic valve replacement  . Breast cancer Neg Hx     Social History Social History   Tobacco Use  . Smoking status: Never Smoker  . Smokeless tobacco: Never Used  Vaping Use  . Vaping Use: Never used  Substance Use Topics  . Alcohol use: Yes    Alcohol/week: 2.0 standard drinks    Types: 2 Glasses of wine per week  . Drug use: No     Allergies   Ciprofloxacin, Sulfa antibiotics, Vioxx [rofecoxib], Penicillins, and Tape   Review of Systems Review of Systems  Constitutional: Positive for fever. Negative for chills.  HENT: Positive for congestion, postnasal drip, sinus pressure and sore throat. Negative for ear pain.   Eyes: Negative for pain and visual disturbance.  Respiratory: Positive for cough. Negative for shortness of breath.   Cardiovascular: Negative for chest pain and palpitations.  Gastrointestinal: Negative for abdominal pain, diarrhea and vomiting.  Genitourinary: Negative for dysuria and hematuria.  Musculoskeletal: Negative for arthralgias and back pain.  Skin: Negative for color change and rash.  Neurological: Negative for seizures and syncope.  All other systems reviewed and are negative.    Physical Exam Triage Vital Signs ED Triage Vitals  Enc Vitals Group     BP      Pulse      Resp      Temp      Temp src      SpO2      Weight      Height      Head Circumference      Peak Flow      Pain Score      Pain Loc      Pain Edu?      Excl. in [place]?    No data found.  Updated Vital Signs BP 133/80   Pulse 95   Temp (!) 100.4 F (38 C)   Resp 16   SpO2 93%   Visual Acuity Right Eye Distance:   Left Eye Distance:   Bilateral Distance:    Right Eye Near:   Left Eye Near:    Bilateral Near:     Physical Exam Vitals and nursing note reviewed.  Constitutional:       General: She is not in acute distress.    Appearance: She is well-developed.  HENT:     Head: Normocephalic and atraumatic.     Right  Ear: Tympanic membrane normal.     Left Ear: Tympanic membrane normal.     Nose: Congestion present.     Mouth/Throat:     Mouth: Mucous membranes are moist.     Pharynx: Oropharynx is clear.  Eyes:     Conjunctiva/sclera: Conjunctivae normal.  Cardiovascular:     Rate and Rhythm: Normal rate and regular rhythm.     Heart sounds: No murmur heard.   Pulmonary:     Effort: Pulmonary effort is normal. No respiratory distress.     Breath sounds: Normal breath sounds.  Abdominal:     Palpations: Abdomen is soft.     Tenderness: There is no abdominal tenderness. There is no guarding or rebound.  Musculoskeletal:     Cervical back: Neck supple.  Skin:    General: Skin is warm and dry.     Findings: No rash.  Neurological:     General: No focal deficit present.     Mental Status: She is alert and oriented to person, place, and time.     Gait: Gait normal.  Psychiatric:        Mood and Affect: Mood normal.        Behavior: Behavior normal.      UC Treatments / Results  Labs (all labs ordered are listed, but only abnormal results are displayed) Labs Reviewed - No data to display  EKG   Radiology No results found.  Procedures Procedures (including critical care time)  Medications Ordered in UC Medications - No data to display  Initial Impression / Assessment and Plan / UC Course  I have reviewed the triage vital signs and the nursing notes.  Pertinent labs & imaging results that were available during my care of the patient were reviewed by me and considered in my medical decision making (see chart for details).    Acute sinusitis. Patient declines COVID test today; she had a negative test 2 days ago.  Treating with Zithromax. Instructed patient to follow-up with her PCP if her symptoms are not improving. Patient agrees to plan of care.       Final Clinical Impressions(s) / UC Diagnoses   Final diagnoses:  Acute non-recurrent maxillary sinusitis     Discharge Instructions     Take the antibiotic as directed.    Follow up with your primary care provider if your symptoms are not improving.       ED Prescriptions    Medication Sig Dispense Auth. Provider   azithromycin (ZITHROMAX) 250 MG tablet Take 1 tablet (250 mg total) by mouth daily. Take first 2 tablets together, then 1 every day until finished. 6 tablet Sharion Balloon, NP     PDMP not reviewed this encounter.   Sharion Balloon, NP 03/10/20 1439

## 2020-03-10 NOTE — ED Triage Notes (Signed)
Patient complains of cough and sinus sx for a little over a week. Reports her chest is achy from coughing.  Patient has a PCR COVID test done at [facility] on Monday and got her negative test results this morning.

## 2020-04-08 ENCOUNTER — Telehealth: Payer: Self-pay | Admitting: Family Medicine

## 2020-04-08 DIAGNOSIS — L814 Other melanin hyperpigmentation: Secondary | ICD-10-CM | POA: Diagnosis not present

## 2020-04-08 DIAGNOSIS — L570 Actinic keratosis: Secondary | ICD-10-CM | POA: Diagnosis not present

## 2020-04-08 DIAGNOSIS — L820 Inflamed seborrheic keratosis: Secondary | ICD-10-CM | POA: Diagnosis not present

## 2020-04-08 DIAGNOSIS — L853 Xerosis cutis: Secondary | ICD-10-CM | POA: Diagnosis not present

## 2020-04-08 NOTE — Telephone Encounter (Signed)
Left message for patient to call back and schedule Medicare Annual Wellness Visit (AWV) either virtually/audio only or in office. Whichever the patients preference is.  No history of AWV; please schedule at anytime with [facility] Health Advisor.  This should be a 40 minute visit  AWV-I AS OF 12/24/2017 PER PALMETTO 

## 2020-04-14 DIAGNOSIS — M810 Age-related osteoporosis without current pathological fracture: Secondary | ICD-10-CM | POA: Diagnosis not present

## 2020-04-14 DIAGNOSIS — I10 Essential (primary) hypertension: Secondary | ICD-10-CM | POA: Diagnosis not present

## 2020-04-15 DIAGNOSIS — Z131 Encounter for screening for diabetes mellitus: Secondary | ICD-10-CM | POA: Diagnosis not present

## 2020-04-15 DIAGNOSIS — E782 Mixed hyperlipidemia: Secondary | ICD-10-CM | POA: Diagnosis not present

## 2020-04-15 DIAGNOSIS — R002 Palpitations: Secondary | ICD-10-CM | POA: Diagnosis not present

## 2020-04-20 DIAGNOSIS — R69 Illness, unspecified: Secondary | ICD-10-CM | POA: Diagnosis not present

## 2020-08-16 DIAGNOSIS — Z780 Asymptomatic menopausal state: Secondary | ICD-10-CM | POA: Diagnosis not present

## 2020-08-16 DIAGNOSIS — Z23 Encounter for immunization: Secondary | ICD-10-CM | POA: Diagnosis not present

## 2020-08-16 DIAGNOSIS — K5790 Diverticulosis of intestine, part unspecified, without perforation or abscess without bleeding: Secondary | ICD-10-CM | POA: Diagnosis not present

## 2020-08-16 DIAGNOSIS — I498 Other specified cardiac arrhythmias: Secondary | ICD-10-CM | POA: Diagnosis not present

## 2020-08-16 DIAGNOSIS — Z1159 Encounter for screening for other viral diseases: Secondary | ICD-10-CM | POA: Diagnosis not present

## 2020-08-16 DIAGNOSIS — Z0000 Encounter for general adult medical examination without abnormal findings: Secondary | ICD-10-CM | POA: Diagnosis not present

## 2020-08-16 DIAGNOSIS — I10 Essential (primary) hypertension: Secondary | ICD-10-CM | POA: Diagnosis not present

## 2020-08-16 DIAGNOSIS — R69 Illness, unspecified: Secondary | ICD-10-CM | POA: Diagnosis not present

## 2020-08-16 DIAGNOSIS — K219 Gastro-esophageal reflux disease without esophagitis: Secondary | ICD-10-CM | POA: Diagnosis not present

## 2020-08-16 DIAGNOSIS — Z79899 Other long term (current) drug therapy: Secondary | ICD-10-CM | POA: Diagnosis not present

## 2020-08-31 ENCOUNTER — Telehealth: Payer: Self-pay | Admitting: Family Medicine

## 2020-08-31 NOTE — Telephone Encounter (Signed)
Left message for patient to call back and schedule Medicare Annual Wellness Visit (AWV) either virtually/audio only or in office. Whichever the patients preference is.  No history of AWV; please schedule at anytime with [facility] Health Advisor.  This should be a 40 minute visit  AWV-I AS OF 12/24/2017 PER PALMETTO

## 2020-10-25 ENCOUNTER — Other Ambulatory Visit: Payer: Self-pay | Admitting: Internal Medicine

## 2020-10-25 DIAGNOSIS — Z1231 Encounter for screening mammogram for malignant neoplasm of breast: Secondary | ICD-10-CM

## 2020-10-26 DIAGNOSIS — M25551 Pain in right hip: Secondary | ICD-10-CM | POA: Diagnosis not present

## 2020-10-26 DIAGNOSIS — S9002XA Contusion of left ankle, initial encounter: Secondary | ICD-10-CM | POA: Diagnosis not present

## 2020-10-26 DIAGNOSIS — W108XXA Fall (on) (from) other stairs and steps, initial encounter: Secondary | ICD-10-CM | POA: Diagnosis not present

## 2020-10-26 DIAGNOSIS — M25572 Pain in left ankle and joints of left foot: Secondary | ICD-10-CM | POA: Diagnosis not present

## 2020-10-27 DIAGNOSIS — M25572 Pain in left ankle and joints of left foot: Secondary | ICD-10-CM | POA: Diagnosis not present

## 2020-10-27 DIAGNOSIS — M25551 Pain in right hip: Secondary | ICD-10-CM | POA: Diagnosis not present

## 2020-10-28 DIAGNOSIS — L821 Other seborrheic keratosis: Secondary | ICD-10-CM | POA: Diagnosis not present

## 2020-10-28 DIAGNOSIS — L820 Inflamed seborrheic keratosis: Secondary | ICD-10-CM | POA: Diagnosis not present

## 2020-10-28 DIAGNOSIS — L570 Actinic keratosis: Secondary | ICD-10-CM | POA: Diagnosis not present

## 2020-10-28 DIAGNOSIS — L814 Other melanin hyperpigmentation: Secondary | ICD-10-CM | POA: Diagnosis not present

## 2020-10-28 DIAGNOSIS — Z85828 Personal history of other malignant neoplasm of skin: Secondary | ICD-10-CM | POA: Diagnosis not present

## 2020-10-28 DIAGNOSIS — D1801 Hemangioma of skin and subcutaneous tissue: Secondary | ICD-10-CM | POA: Diagnosis not present

## 2020-11-03 DIAGNOSIS — M79672 Pain in left foot: Secondary | ICD-10-CM | POA: Diagnosis not present

## 2020-11-03 DIAGNOSIS — R262 Difficulty in walking, not elsewhere classified: Secondary | ICD-10-CM | POA: Diagnosis not present

## 2020-11-03 DIAGNOSIS — M25572 Pain in left ankle and joints of left foot: Secondary | ICD-10-CM | POA: Diagnosis not present

## 2020-11-03 DIAGNOSIS — S92355A Nondisplaced fracture of fifth metatarsal bone, left foot, initial encounter for closed fracture: Secondary | ICD-10-CM | POA: Diagnosis not present

## 2020-11-03 DIAGNOSIS — S93402A Sprain of unspecified ligament of left ankle, initial encounter: Secondary | ICD-10-CM | POA: Diagnosis not present

## 2020-11-03 DIAGNOSIS — M25472 Effusion, left ankle: Secondary | ICD-10-CM | POA: Diagnosis not present

## 2020-11-03 DIAGNOSIS — S86312A Strain of muscle(s) and tendon(s) of peroneal muscle group at lower leg level, left leg, initial encounter: Secondary | ICD-10-CM | POA: Diagnosis not present

## 2020-11-17 DIAGNOSIS — S93402D Sprain of unspecified ligament of left ankle, subsequent encounter: Secondary | ICD-10-CM | POA: Diagnosis not present

## 2020-11-17 DIAGNOSIS — M79672 Pain in left foot: Secondary | ICD-10-CM | POA: Diagnosis not present

## 2020-11-17 DIAGNOSIS — S92355D Nondisplaced fracture of fifth metatarsal bone, left foot, subsequent encounter for fracture with routine healing: Secondary | ICD-10-CM | POA: Diagnosis not present

## 2020-11-17 DIAGNOSIS — S86312D Strain of muscle(s) and tendon(s) of peroneal muscle group at lower leg level, left leg, subsequent encounter: Secondary | ICD-10-CM | POA: Diagnosis not present

## 2020-11-17 DIAGNOSIS — M25572 Pain in left ankle and joints of left foot: Secondary | ICD-10-CM | POA: Diagnosis not present

## 2020-11-17 DIAGNOSIS — M25472 Effusion, left ankle: Secondary | ICD-10-CM | POA: Diagnosis not present

## 2020-11-19 DIAGNOSIS — S92355A Nondisplaced fracture of fifth metatarsal bone, left foot, initial encounter for closed fracture: Secondary | ICD-10-CM | POA: Diagnosis not present

## 2020-12-02 DIAGNOSIS — M25572 Pain in left ankle and joints of left foot: Secondary | ICD-10-CM | POA: Diagnosis not present

## 2020-12-02 DIAGNOSIS — S86312D Strain of muscle(s) and tendon(s) of peroneal muscle group at lower leg level, left leg, subsequent encounter: Secondary | ICD-10-CM | POA: Diagnosis not present

## 2020-12-02 DIAGNOSIS — S92355D Nondisplaced fracture of fifth metatarsal bone, left foot, subsequent encounter for fracture with routine healing: Secondary | ICD-10-CM | POA: Diagnosis not present

## 2020-12-02 DIAGNOSIS — S93402D Sprain of unspecified ligament of left ankle, subsequent encounter: Secondary | ICD-10-CM | POA: Diagnosis not present

## 2020-12-02 DIAGNOSIS — M79672 Pain in left foot: Secondary | ICD-10-CM | POA: Diagnosis not present

## 2020-12-06 ENCOUNTER — Ambulatory Visit
Admission: RE | Admit: 2020-12-06 | Discharge: 2020-12-06 | Disposition: A | Discharge status: Home / Self Care | Payer: Medicare HMO | Source: Ambulatory Visit | Attending: Emergency Medicine | Admitting: Emergency Medicine

## 2020-12-06 ENCOUNTER — Other Ambulatory Visit: Payer: Self-pay

## 2020-12-06 VITALS — BP 128/77 | HR 66 | Temp 99.1°F | Resp 13

## 2020-12-06 DIAGNOSIS — R300 Dysuria: Secondary | ICD-10-CM

## 2020-12-06 LAB — POCT URINALYSIS DIP (MANUAL ENTRY)
Bilirubin, UA: NEGATIVE
Glucose, UA: NEGATIVE mg/dL
Ketones, POC UA: NEGATIVE mg/dL
Nitrite, UA: NEGATIVE
Protein Ur, POC: NEGATIVE mg/dL
Spec Grav, UA: 1.02 (ref 1.010–1.025)
Urobilinogen, UA: 0.2 E.U./dL
pH, UA: 7 (ref 5.0–8.0)

## 2020-12-06 MED ORDER — NITROFURANTOIN MONOHYD MACRO 100 MG PO CAPS: 100.0000 mg | ORAL_CAPSULE | Freq: Two times a day (BID) | ORAL | 0 refills | Status: AC

## 2020-12-06 NOTE — Discharge Instructions (Addendum)
Take the antibiotic as directed.  The urine culture is pending.  We will call you if it shows the need to change or discontinue your antibiotic.    Follow up with your primary care provider if your symptoms are not improving.    

## 2020-12-06 NOTE — ED Triage Notes (Signed)
Patient c/o dysuria and increased frequency x 3 days.   Patient endorses "burning with urination".   Patient endorses lower back pain and RT sided lower ABD pain.   Patient denies any foul smell, hematuria, and vaginal discharge.   Patient used AZO with some relief of symptoms.

## 2020-12-06 NOTE — ED Provider Notes (Signed)
With your [name]    CSN: [id number] Arrival date & time: 12/06/20  0941      History   Chief Complaint Chief Complaint  Patient presents with   Dysuria   Urinary Frequency   1000 APPT    HPI Leslie Silva is a 70 y.o. female.  Patient presents with 3-day history of burning with urination.  She also reports urinary frequency, lower abdominal discomfort and low back pain.  She denies fever, chills, hematuria, vomiting, diarrhea, vaginal discharge, pelvic pain, or other symptoms.  Treatment at home with Azo may: Last taken 2 days ago.  Her medical history includes hypertension, mitral valve prolapse, diverticulosis, asthma, depression.  The history is provided by the patient and medical records.   Past Medical History:  Diagnosis Date   Arthritis    Asthma    Cancer ([place])    skin   Depression    Diverticulosis    Hypertension    Mitral valve prolapse    Shingles    age 60     Patient Active Problem List   Diagnosis Date Noted   Abdominal pain, right upper quadrant 05/15/2018   Acute appendicitis with perforation, localized peritonitis, and gangrene, without abscess 11/05/2017   Appendicitis, acute 11/05/2017   Osteoporosis 02/23/2017   Asthma 01/13/2017   Insomnia 01/13/2017   Arthritis 03/12/2015   Acid reflux 03/12/2015   BP (high blood pressure) 03/12/2015   Disc disorder 03/12/2015   Cardiac conduction disorder 05/19/2014   Basal cell carcinoma 12/24/2013   Personal history of other malignant neoplasm of skin 12/24/2013   Diverticulitis 08/07/2013    Past Surgical History:  Procedure Laterality Date   APPENDECTOMY     BACK SURGERY  2011   BASAL CELL CARCINOMA EXCISION  12/2013   BREAST CYST ASPIRATION Left    CATARACT EXTRACTION Bilateral    COLONOSCOPY  08/27/2013   cleared for 10 yrs- divert- Dr Eliberto Ivory Duke   FOOT NEUROMA SURGERY Left 2006   LAPAROSCOPIC APPENDECTOMY N/A 11/05/2017   Procedure: APPENDECTOMY LAPAROSCOPIC;  Surgeon:  Robert Bellow, MD;  Location: [facility] ORS;  Service: General;  Laterality: N/A;    OB History     Gravida  3   Para  2   Term  2   Preterm      AB  1   Living  2      SAB  1   IAB      Ectopic      Multiple      Live Births  2            Home Medications    Prior to Admission medications   Medication Sig Start Date End Date Taking? Authorizing Provider  cholecalciferol (VITAMIN D) 1000 units tablet Take 1,000 Units by mouth daily.   Yes [provider]  ibuprofen (ADVIL) 600 MG tablet Take 1 tablet (600 mg total) by mouth every 8 (eight) hours as needed. 09/01/19  Yes Juline Patch, MD  metoprolol succinate (TOPROL-XL) 50 MG 24 hr tablet TAKE 1 TABLET BY MOUTH DAILY WITH OR IMMEDIATELY FOLLOWING A MEAL 09/26/16  Yes Juline Patch, MD  nitrofurantoin, macrocrystal-monohydrate, (MACROBID) 100 MG capsule Take 1 capsule (100 mg total) by mouth 2 (two) times daily. 12/06/20  Yes Sharion Balloon, NP  albuterol (PROVENTIL) (2.5 MG/3ML) 0.083% nebulizer solution Take 3 mLs (2.5 mg total) by nebulization every 6 (six) hours as needed for wheezing or shortness of breath. 03/08/20  Juline Patch, MD  cyclobenzaprine (FLEXERIL) 10 MG tablet Take 1 tablet (10 mg total) by mouth 3 (three) times daily as needed for muscle spasms. Patient not taking: No sig reported 09/01/19   Juline Patch, MD  loteprednol (LOTEMAX) 0.2 % SUSP as needed (for allergies) 03/20/14   [provider]  metoprolol tartrate (LOPRESSOR) 25 MG tablet Take 1 tablet by mouth as needed. cardiologist 03/03/16 10/31/19  [provider]  traMADol (ULTRAM) 50 MG tablet Take 1 tablet (50 mg total) by mouth every 6 (six) hours as needed for moderate pain. Patient not taking: No sig reported 10/07/19   Sable Feil, PA-C    Family History Family History  Problem Relation Age of Onset   Cancer Mother    Liver cancer Mother 47   Heart disease Father    Asthma Brother    Liver cancer  Brother 77   Heart disease Maternal Aunt        aortic valve replacement   Heart disease Cousin        aortic valve replacement   Breast cancer Neg Hx     Social History Social History   Tobacco Use   Smoking status: Never   Smokeless tobacco: Never  Vaping Use   Vaping Use: Never used  Substance Use Topics   Alcohol use: Yes    Alcohol/week: 2.0 standard drinks    Types: 2 Glasses of wine per week   Drug use: No     Allergies   Ciprofloxacin, Sulfa antibiotics, Vioxx [rofecoxib], Penicillins, and Tape   Review of Systems Review of Systems  Constitutional:  Negative for chills and fever.  Respiratory:  Negative for cough and shortness of breath.   Cardiovascular:  Negative for chest pain and palpitations.  Gastrointestinal:  Positive for abdominal pain. Negative for diarrhea and vomiting.  Genitourinary:  Positive for dysuria and frequency. Negative for hematuria, pelvic pain and vaginal discharge.  Musculoskeletal:  Positive for back pain. Negative for arthralgias.  Skin:  Negative for color change and rash.  All other systems reviewed and are negative.   Physical Exam Triage Vital Signs ED Triage Vitals  Enc Vitals Group     BP      Pulse      Resp      Temp      Temp src      SpO2      Weight      Height      Head Circumference      Peak Flow      Pain Score      Pain Loc      Pain Edu?      Excl. in [place]?    No data found.  Updated Vital Signs BP 128/77 (BP Location: Left Arm)   Pulse 66   Temp 99.1 F (37.3 C) (Oral)   Resp 13   SpO2 96%   Visual Acuity Right Eye Distance:   Left Eye Distance:   Bilateral Distance:    Right Eye Near:   Left Eye Near:    Bilateral Near:     Physical Exam Vitals and nursing note reviewed.  Constitutional:      General: She is not in acute distress.    Appearance: She is well-developed. She is not ill-appearing.  HENT:     Head: Normocephalic and atraumatic.     Mouth/Throat:     Mouth: Mucous  membranes are moist.  Eyes:  Conjunctiva/sclera: Conjunctivae normal.  Cardiovascular:     Rate and Rhythm: Normal rate and regular rhythm.     Heart sounds: Normal heart sounds.  Pulmonary:     Effort: Pulmonary effort is normal. No respiratory distress.     Breath sounds: Normal breath sounds.  Abdominal:     General: There is no distension.     Palpations: Abdomen is soft.     Tenderness: There is no abdominal tenderness. There is no right CVA tenderness, guarding or rebound.  Musculoskeletal:     Cervical back: Neck supple.  Skin:    General: Skin is warm and dry.  Neurological:     General: No focal deficit present.     Mental Status: She is alert and oriented to person, place, and time.     Gait: Gait normal.  Psychiatric:        Mood and Affect: Mood normal.        Behavior: Behavior normal.     UC Treatments / Results  Labs (all labs ordered are listed, but only abnormal results are displayed) Labs Reviewed  POCT URINALYSIS DIP (MANUAL ENTRY) - Abnormal; Notable for the following components:      Result Value   Blood, UA trace-intact (*)    Leukocytes, UA Trace (*)    All other components within normal limits  URINE CULTURE    EKG   Radiology No results found.  Procedures Procedures (including critical care time)  Medications Ordered in UC Medications - No data to display  Initial Impression / Assessment and Plan / UC Course  I have reviewed the triage vital signs and the nursing notes.  Pertinent labs & imaging results that were available during my care of the patient were reviewed by me and considered in my medical decision making (see chart for details).  Dysuria.  Urine culture pending.  Patient has antibiotic allergies, including Cipro, sulfa, penicillin.  Treating today with nitrofurantoin.  Patient had GFR of 99 on 08/16/2020.  Discussed with patient that we will call her if the urine culture shows the need to change or discontinue the  antibiotic.  Instructed her to follow-up with her PCP if her symptoms are not improving.  She agrees to plan of care.    Final Clinical Impressions(s) / UC Diagnoses   Final diagnoses:  Dysuria     Discharge Instructions      Take the antibiotic as directed.  The urine culture is pending.  We will call you if it shows the need to change or discontinue your antibiotic.    Follow up with your primary care provider if your symptoms are not improving.         ED Prescriptions     Medication Sig Dispense Auth. Provider   nitrofurantoin, macrocrystal-monohydrate, (MACROBID) 100 MG capsule Take 1 capsule (100 mg total) by mouth 2 (two) times daily. 10 capsule Sharion Balloon, NP      PDMP not reviewed this encounter.   Sharion Balloon, NP 12/06/20 1040

## 2020-12-07 LAB — URINE CULTURE: Culture: NO GROWTH

## 2020-12-21 ENCOUNTER — Other Ambulatory Visit: Payer: Self-pay

## 2020-12-21 ENCOUNTER — Ambulatory Visit
Admission: RE | Admit: 2020-12-21 | Discharge: 2020-12-21 | Disposition: A | Discharge status: Home / Self Care | Payer: Medicare HMO | Source: Ambulatory Visit | Attending: Internal Medicine | Admitting: Internal Medicine

## 2020-12-21 DIAGNOSIS — Z1231 Encounter for screening mammogram for malignant neoplasm of breast: Secondary | ICD-10-CM | POA: Diagnosis not present

## 2020-12-31 DIAGNOSIS — S86312D Strain of muscle(s) and tendon(s) of peroneal muscle group at lower leg level, left leg, subsequent encounter: Secondary | ICD-10-CM | POA: Diagnosis not present

## 2020-12-31 DIAGNOSIS — M25572 Pain in left ankle and joints of left foot: Secondary | ICD-10-CM | POA: Diagnosis not present

## 2020-12-31 DIAGNOSIS — M79672 Pain in left foot: Secondary | ICD-10-CM | POA: Diagnosis not present

## 2020-12-31 DIAGNOSIS — S93402D Sprain of unspecified ligament of left ankle, subsequent encounter: Secondary | ICD-10-CM | POA: Diagnosis not present

## 2020-12-31 DIAGNOSIS — M25472 Effusion, left ankle: Secondary | ICD-10-CM | POA: Diagnosis not present

## 2020-12-31 DIAGNOSIS — S92355D Nondisplaced fracture of fifth metatarsal bone, left foot, subsequent encounter for fracture with routine healing: Secondary | ICD-10-CM | POA: Diagnosis not present

## 2021-02-11 DIAGNOSIS — I499 Cardiac arrhythmia, unspecified: Secondary | ICD-10-CM | POA: Diagnosis not present

## 2021-02-11 DIAGNOSIS — R69 Illness, unspecified: Secondary | ICD-10-CM | POA: Diagnosis not present

## 2021-04-13 DIAGNOSIS — Z131 Encounter for screening for diabetes mellitus: Secondary | ICD-10-CM | POA: Diagnosis not present

## 2021-04-13 DIAGNOSIS — Z79899 Other long term (current) drug therapy: Secondary | ICD-10-CM | POA: Diagnosis not present

## 2021-04-13 DIAGNOSIS — Z780 Asymptomatic menopausal state: Secondary | ICD-10-CM | POA: Diagnosis not present

## 2021-04-13 DIAGNOSIS — I498 Other specified cardiac arrhythmias: Secondary | ICD-10-CM | POA: Diagnosis not present

## 2021-04-13 DIAGNOSIS — I10 Essential (primary) hypertension: Secondary | ICD-10-CM | POA: Diagnosis not present

## 2021-04-13 DIAGNOSIS — R69 Illness, unspecified: Secondary | ICD-10-CM | POA: Diagnosis not present

## 2021-04-20 DIAGNOSIS — I471 Supraventricular tachycardia: Secondary | ICD-10-CM | POA: Diagnosis not present

## 2021-04-20 DIAGNOSIS — I10 Essential (primary) hypertension: Secondary | ICD-10-CM | POA: Diagnosis not present

## 2021-04-20 DIAGNOSIS — R002 Palpitations: Secondary | ICD-10-CM | POA: Diagnosis not present

## 2021-05-12 DIAGNOSIS — L853 Xerosis cutis: Secondary | ICD-10-CM | POA: Diagnosis not present

## 2021-05-12 DIAGNOSIS — L570 Actinic keratosis: Secondary | ICD-10-CM | POA: Diagnosis not present

## 2021-05-13 DIAGNOSIS — I10 Essential (primary) hypertension: Secondary | ICD-10-CM | POA: Diagnosis not present

## 2021-05-13 DIAGNOSIS — Z79899 Other long term (current) drug therapy: Secondary | ICD-10-CM | POA: Diagnosis not present

## 2021-05-13 DIAGNOSIS — R69 Illness, unspecified: Secondary | ICD-10-CM | POA: Diagnosis not present

## 2021-05-13 DIAGNOSIS — Z131 Encounter for screening for diabetes mellitus: Secondary | ICD-10-CM | POA: Diagnosis not present

## 2021-05-13 DIAGNOSIS — H8111 Benign paroxysmal vertigo, right ear: Secondary | ICD-10-CM | POA: Diagnosis not present

## 2021-05-23 DIAGNOSIS — U071 COVID-19: Secondary | ICD-10-CM | POA: Diagnosis not present

## 2021-05-23 DIAGNOSIS — I10 Essential (primary) hypertension: Secondary | ICD-10-CM | POA: Diagnosis not present

## 2021-06-08 DIAGNOSIS — R21 Rash and other nonspecific skin eruption: Secondary | ICD-10-CM | POA: Diagnosis not present

## 2021-06-08 DIAGNOSIS — I10 Essential (primary) hypertension: Secondary | ICD-10-CM | POA: Diagnosis not present

## 2021-06-08 DIAGNOSIS — M545 Low back pain, unspecified: Secondary | ICD-10-CM | POA: Diagnosis not present

## 2021-11-09 ENCOUNTER — Other Ambulatory Visit: Payer: Self-pay | Admitting: Physician Assistant

## 2021-11-09 DIAGNOSIS — M50123 Cervical disc disorder at C6-C7 level with radiculopathy: Secondary | ICD-10-CM

## 2021-11-09 DIAGNOSIS — M542 Cervicalgia: Secondary | ICD-10-CM

## 2021-11-18 ENCOUNTER — Other Ambulatory Visit: Payer: Self-pay | Admitting: Internal Medicine

## 2021-11-18 DIAGNOSIS — Z1231 Encounter for screening mammogram for malignant neoplasm of breast: Secondary | ICD-10-CM

## 2021-11-23 ENCOUNTER — Ambulatory Visit: Payer: Medicare HMO

## 2021-12-21 ENCOUNTER — Other Ambulatory Visit: Payer: Self-pay | Admitting: Internal Medicine

## 2021-12-21 DIAGNOSIS — Z13820 Encounter for screening for osteoporosis: Secondary | ICD-10-CM

## 2022-01-16 ENCOUNTER — Ambulatory Visit
Admission: RE | Admit: 2022-01-16 | Discharge: 2022-01-16 | Disposition: A | Discharge status: Home / Self Care | Payer: Medicare HMO | Source: Ambulatory Visit | Attending: Internal Medicine | Admitting: Internal Medicine

## 2022-01-16 DIAGNOSIS — Z780 Asymptomatic menopausal state: Secondary | ICD-10-CM | POA: Insufficient documentation

## 2022-01-16 DIAGNOSIS — Z1231 Encounter for screening mammogram for malignant neoplasm of breast: Secondary | ICD-10-CM | POA: Insufficient documentation

## 2022-01-16 DIAGNOSIS — Z13820 Encounter for screening for osteoporosis: Secondary | ICD-10-CM | POA: Insufficient documentation

## 2022-01-16 DIAGNOSIS — M810 Age-related osteoporosis without current pathological fracture: Secondary | ICD-10-CM | POA: Insufficient documentation

## 2022-03-01 ENCOUNTER — Other Ambulatory Visit: Payer: Medicare HMO

## 2022-06-27 ENCOUNTER — Ambulatory Visit (INDEPENDENT_AMBULATORY_CARE_PROVIDER_SITE_OTHER): Payer: Medicare HMO

## 2022-06-27 ENCOUNTER — Ambulatory Visit
Admission: RE | Admit: 2022-06-27 | Discharge: 2022-06-27 | Disposition: A | Discharge status: Home / Self Care | Payer: Medicare HMO | Source: Ambulatory Visit | Attending: Nurse Practitioner | Admitting: Nurse Practitioner

## 2022-06-27 VITALS — BP 145/82 | HR 91 | Temp 98.7°F | Resp 16

## 2022-06-27 DIAGNOSIS — R062 Wheezing: Secondary | ICD-10-CM

## 2022-06-27 DIAGNOSIS — J208 Acute bronchitis due to other specified organisms: Secondary | ICD-10-CM | POA: Diagnosis not present

## 2022-06-27 DIAGNOSIS — R051 Acute cough: Secondary | ICD-10-CM

## 2022-06-27 MED ORDER — PROMETHAZINE-DM 6.25-15 MG/5ML PO SYRP: 2.5000 mL | ORAL_SOLUTION | Freq: Every evening | ORAL | 0 refills | Status: AC | PRN

## 2022-06-27 MED ORDER — ALBUTEROL SULFATE (2.5 MG/3ML) 0.083% IN NEBU
2.5000 mg | INHALATION_SOLUTION | Freq: Once | RESPIRATORY_TRACT | Status: AC
  Administered 2022-06-27: 2.5 mg via RESPIRATORY_TRACT

## 2022-06-27 MED ORDER — PREDNISONE 20 MG PO TABS: 40.0000 mg | ORAL_TABLET | Freq: Every day | ORAL | 0 refills | Status: AC

## 2022-06-27 NOTE — ED Triage Notes (Signed)
Patient presents to UC for cough, congestion, fever, body aches, nausea, and sore throat since Saturday. Taking tylenol and mucinex.

## 2022-06-27 NOTE — ED Provider Notes (Signed)
Leslie Silva    CSN: [id number] Arrival date & time: 06/27/22  1729      History   Chief Complaint Chief Complaint  Patient presents with   Cough    Chills, Fever 101, aches, sore throat beginning Saturday night. Started getting better but last night fever is back and chest congestion worse. - Entered by patient   Sore Throat   Fever   Nausea   Chills   Generalized Body Aches    HPI Leslie Silva is a 72 y.o. female female presents for evaluation of URI symptoms for 4 days. Patient reports associated symptoms of cough, congestion, sore throat, fever, nausea, chills, body aches. Denies V/D, ear pain or shortness of breath. Patient does  have a hx of allergy induced asthma.  Does an albuterol inhaler and nebulizer at home that she has used with temporary improvement of her wheezing.  No smoking. No known sick contacts and no recent travel. Pt is vaccinated for COVID. Pt is not vaccinated for flu this season.  Reports a negative home COVID test.  Pt has taken Tylenol and Mucinex OTC for symptoms. Pt has no other concerns at this time.    Cough Associated symptoms: chills, fever and sore throat   Sore Throat  Fever Associated symptoms: chills, cough, nausea and sore throat     Past Medical History:  Diagnosis Date   Arthritis    Asthma    Cancer ([place])    skin   Depression    Diverticulosis    Hypertension    Mitral valve prolapse    Shingles    age 35     Patient Active Problem List   Diagnosis Date Noted   Abdominal pain, right upper quadrant 05/15/2018   Acute appendicitis with perforation, localized peritonitis, and gangrene, without abscess 11/05/2017   Appendicitis, acute 11/05/2017   Osteoporosis 02/23/2017   Asthma 01/13/2017   Insomnia 01/13/2017   Arthritis 03/12/2015   Acid reflux 03/12/2015   BP (high blood pressure) 03/12/2015   Disc disorder 03/12/2015   Cardiac conduction disorder 05/19/2014   Basal cell carcinoma 12/24/2013   Personal  history of other malignant neoplasm of skin 12/24/2013   Diverticulitis 08/07/2013    Past Surgical History:  Procedure Laterality Date   APPENDECTOMY     BACK SURGERY  2011   BASAL CELL CARCINOMA EXCISION  12/2013   BREAST CYST ASPIRATION Left    CATARACT EXTRACTION Bilateral    COLONOSCOPY  08/27/2013   cleared for 10 yrs- divert- Dr Eliberto Ivory [facility]   FOOT NEUROMA SURGERY Left 2006   LAPAROSCOPIC APPENDECTOMY N/A 11/05/2017   Procedure: APPENDECTOMY LAPAROSCOPIC;  Surgeon: Robert Bellow, MD;  Location: [facility] ORS;  Service: General;  Laterality: N/A;    OB History     Gravida  3   Para  2   Term  2   Preterm      AB  1   Living  2      SAB  1   IAB      Ectopic      Multiple      Live Births  2            Home Medications    Prior to Admission medications   Medication Sig Start Date End Date Taking? Authorizing Provider  predniSONE (DELTASONE) 20 MG tablet Take 2 tablets (40 mg total) by mouth daily with breakfast for 5 days. 06/27/22 07/02/22 Yes Melynda Ripple, NP  promethazine-dextromethorphan (PROMETHAZINE-DM) 6.25-15 MG/5ML syrup Take 2.5 mLs by mouth at bedtime as needed for cough. 06/27/22  Yes Melynda Ripple, NP  albuterol (PROVENTIL) (2.5 MG/3ML) 0.083% nebulizer solution Take 3 mLs (2.5 mg total) by nebulization every 6 (six) hours as needed for wheezing or shortness of breath. 03/08/20   Juline Patch, MD  cholecalciferol (VITAMIN D) 1000 units tablet Take 1,000 Units by mouth daily.    [provider]  cyclobenzaprine (FLEXERIL) 10 MG tablet Take 1 tablet (10 mg total) by mouth 3 (three) times daily as needed for muscle spasms. Patient not taking: Reported on 10/31/2019 09/01/19   Juline Patch, MD  ibuprofen (ADVIL) 600 MG tablet Take 1 tablet (600 mg total) by mouth every 8 (eight) hours as needed. 09/01/19   Juline Patch, MD  loteprednol (LOTEMAX) 0.2 % SUSP as needed (for allergies) 03/20/14   [provider]  metoprolol succinate  (TOPROL-XL) 50 MG 24 hr tablet TAKE 1 TABLET BY MOUTH DAILY WITH OR IMMEDIATELY FOLLOWING A MEAL 09/26/16   Juline Patch, MD  metoprolol tartrate (LOPRESSOR) 25 MG tablet Take 1 tablet by mouth as needed. cardiologist 03/03/16 10/31/19  [provider]  nitrofurantoin, macrocrystal-monohydrate, (MACROBID) 100 MG capsule Take 1 capsule (100 mg total) by mouth 2 (two) times daily. 12/06/20   Sharion Balloon, NP  traMADol (ULTRAM) 50 MG tablet Take 1 tablet (50 mg total) by mouth every 6 (six) hours as needed for moderate pain. Patient not taking: No sig reported 10/07/19   Sable Feil, PA-C    Family History Family History  Problem Relation Age of Onset   Cancer Mother    Liver cancer Mother 72   Heart disease Father    Heart disease Maternal Aunt        aortic valve replacement   Breast cancer Paternal Aunt    Heart disease Cousin        aortic valve replacement   Asthma Brother    Liver cancer Brother 51    Social History Social History   Tobacco Use   Smoking status: Never   Smokeless tobacco: Never  Vaping Use   Vaping Use: Never used  Substance Use Topics   Alcohol use: Yes    Alcohol/week: 2.0 standard drinks of alcohol    Types: 2 Glasses of wine per week   Drug use: No     Allergies   Ciprofloxacin, Sulfa antibiotics, Vioxx [rofecoxib], Penicillins, and Tape   Review of Systems Review of Systems  Constitutional:  Positive for chills and fever.  HENT:  Positive for sore throat.   Respiratory:  Positive for cough.   Gastrointestinal:  Positive for nausea.     Physical Exam Triage Vital Signs ED Triage Vitals  Enc Vitals Group     BP 06/27/22 1802 (!) 145/82     Pulse Rate 06/27/22 1802 91     Resp 06/27/22 1802 16     Temp 06/27/22 1802 98.7 F (37.1 C)     Temp Source 06/27/22 1802 Temporal     SpO2 06/27/22 1802 93 %     Weight --      Height --      Head Circumference --      Peak Flow --      Pain Score 06/27/22 1800 3     Pain Loc --       Pain Edu? --      Excl. in [place]? --  No data found.  Updated Vital Signs BP (!) 145/82   Pulse 91   Temp 98.7 F (37.1 C) (Temporal)   Resp 16   SpO2 93%   Visual Acuity Right Eye Distance:   Left Eye Distance:   Bilateral Distance:    Right Eye Near:   Left Eye Near:    Bilateral Near:     Physical Exam Vitals and nursing note reviewed.  Constitutional:      General: She is not in acute distress.    Appearance: She is well-developed. She is not ill-appearing.  HENT:     Head: Normocephalic and atraumatic.     Right Ear: Tympanic membrane and ear canal normal.     Left Ear: Tympanic membrane and ear canal normal.     Nose: Congestion present.     Mouth/Throat:     Mouth: Mucous membranes are moist.     Pharynx: Oropharynx is clear. Uvula midline. Posterior oropharyngeal erythema present.     Tonsils: No tonsillar exudate or tonsillar abscesses.  Eyes:     Conjunctiva/sclera: Conjunctivae normal.     Pupils: Pupils are equal, round, and reactive to light.  Cardiovascular:     Rate and Rhythm: Normal rate and regular rhythm.     Heart sounds: Normal heart sounds.  Pulmonary:     Effort: Pulmonary effort is normal.     Breath sounds: Wheezing present.     Comments: Mild expiratory wheezing bilaterally improved after nebulizer Musculoskeletal:     Cervical back: Normal range of motion and neck supple.  Lymphadenopathy:     Cervical: No cervical adenopathy.  Skin:    General: Skin is warm and dry.  Neurological:     General: No focal deficit present.     Mental Status: She is alert and oriented to person, place, and time.  Psychiatric:        Mood and Affect: Mood normal.        Behavior: Behavior normal.      UC Treatments / Results  Labs (all labs ordered are listed, but only abnormal results are displayed) Labs Reviewed - No data to display  EKG   Radiology DG Chest 2 View  Result Date: 06/27/2022 CLINICAL DATA:  Cough and fever.  Shortness  of breath with wheezing. EXAM: CHEST - 2 VIEW COMPARISON:  11/13/2017 FINDINGS: Heart size and mediastinal contours are unremarkable. Diffuse bronchial wall thickening noted. Peripheral opacity within the left base may represent an area of atelectasis or scar. No signs of pleural effusion or edema. No airspace disease. Visualized osseous structures are unremarkable. IMPRESSION: 1. Diffuse bronchial wall thickening compatible with bronchitis. 2. Peripheral opacity within the left base may represent an area of atelectasis or scar. Electronically Signed   By: Kerby Moors M.D.   On: 06/27/2022 18:54    Procedures Procedures (including critical care time)  Medications Ordered in UC Medications  albuterol (PROVENTIL) (2.5 MG/3ML) 0.083% nebulizer solution 2.5 mg (2.5 mg Nebulization Given 06/27/22 1842)    Initial Impression / Assessment and Plan / UC Course  I have reviewed the triage vital signs and the nursing notes.  Pertinent labs & imaging results that were available during my care of the patient were reviewed by me and considered in my medical decision making (see chart for details).     Exam and symptoms with patient.  No red flags on exam Discussed viral bronchitis Prednisone daily for 5 days Promethazine DM at night only for cough She is  to continue Mucinex during the day Continue albuterol inhaler as needed Rest and fluids Follow-up with PCP 2 to 3 days for recheck ER precautions reviewed and patient verbalized understanding Final Clinical Impressions(s) / UC Diagnoses   Final diagnoses:  Acute cough  Wheezing  Viral bronchitis     Discharge Instructions      Prednisone daily for 5 days Promethazine DM at night as needed for cough Consider Mucinex during the day Continue albuterol inhaler/nebulizer as you need it Rest and fluids Follow-up with your PCP 2 to 3 days for recheck Please go to the emergency room if you have any worsening symptoms    ED Prescriptions      Medication Sig Dispense Auth. Provider   predniSONE (DELTASONE) 20 MG tablet Take 2 tablets (40 mg total) by mouth daily with breakfast for 5 days. 10 tablet Melynda Ripple, NP   promethazine-dextromethorphan (PROMETHAZINE-DM) 6.25-15 MG/5ML syrup Take 2.5 mLs by mouth at bedtime as needed for cough. 118 mL Melynda Ripple, NP      PDMP not reviewed this encounter.   Melynda Ripple, NP 06/27/22 [phone]

## 2022-06-27 NOTE — Discharge Instructions (Signed)
Prednisone daily for 5 days Promethazine DM at night as needed for cough Consider Mucinex during the day Continue albuterol inhaler/nebulizer as you need it Rest and fluids Follow-up with your PCP 2 to 3 days for recheck Please go to the emergency room if you have any worsening symptoms

## 2022-07-12 ENCOUNTER — Other Ambulatory Visit: Payer: Self-pay | Admitting: Internal Medicine

## 2022-07-12 DIAGNOSIS — L03211 Cellulitis of face: Secondary | ICD-10-CM

## 2022-07-12 DIAGNOSIS — J0101 Acute recurrent maxillary sinusitis: Secondary | ICD-10-CM

## 2022-07-17 ENCOUNTER — Ambulatory Visit
Admission: RE | Admit: 2022-07-17 | Discharge: 2022-07-17 | Disposition: A | Discharge status: Home / Self Care | Payer: Medicare HMO | Source: Ambulatory Visit | Attending: Internal Medicine | Admitting: Internal Medicine

## 2022-07-17 DIAGNOSIS — L03211 Cellulitis of face: Secondary | ICD-10-CM | POA: Diagnosis present

## 2022-07-17 DIAGNOSIS — J0101 Acute recurrent maxillary sinusitis: Secondary | ICD-10-CM | POA: Diagnosis not present

## 2023-04-18 ENCOUNTER — Other Ambulatory Visit: Payer: Self-pay | Admitting: Internal Medicine

## 2023-04-18 DIAGNOSIS — Z1231 Encounter for screening mammogram for malignant neoplasm of breast: Secondary | ICD-10-CM

## 2023-05-01 ENCOUNTER — Ambulatory Visit
Admission: RE | Admit: 2023-05-01 | Discharge: 2023-05-01 | Disposition: A | Discharge status: Home / Self Care | Payer: Medicare HMO | Source: Ambulatory Visit | Attending: Internal Medicine | Admitting: Internal Medicine

## 2023-05-01 DIAGNOSIS — Z1231 Encounter for screening mammogram for malignant neoplasm of breast: Secondary | ICD-10-CM | POA: Insufficient documentation

## 2023-05-25 IMAGING — MG MM DIGITAL SCREENING BILAT W/ TOMO AND CAD
6 of 10 series · 6 of 30 positions shown · non-contrast
Comparison: Previous exam(s).

CLINICAL DATA: Screening.

EXAM:
DIGITAL SCREENING BILATERAL MAMMOGRAM WITH TOMOSYNTHESIS AND CAD
TECHNIQUE: Bilateral screening digital craniocaudal and mediolateral oblique
mammograms were obtained. Bilateral screening digital breast
tomosynthesis was performed. The images were evaluated with
computer-aided detection.

[L MLO synth-2D]
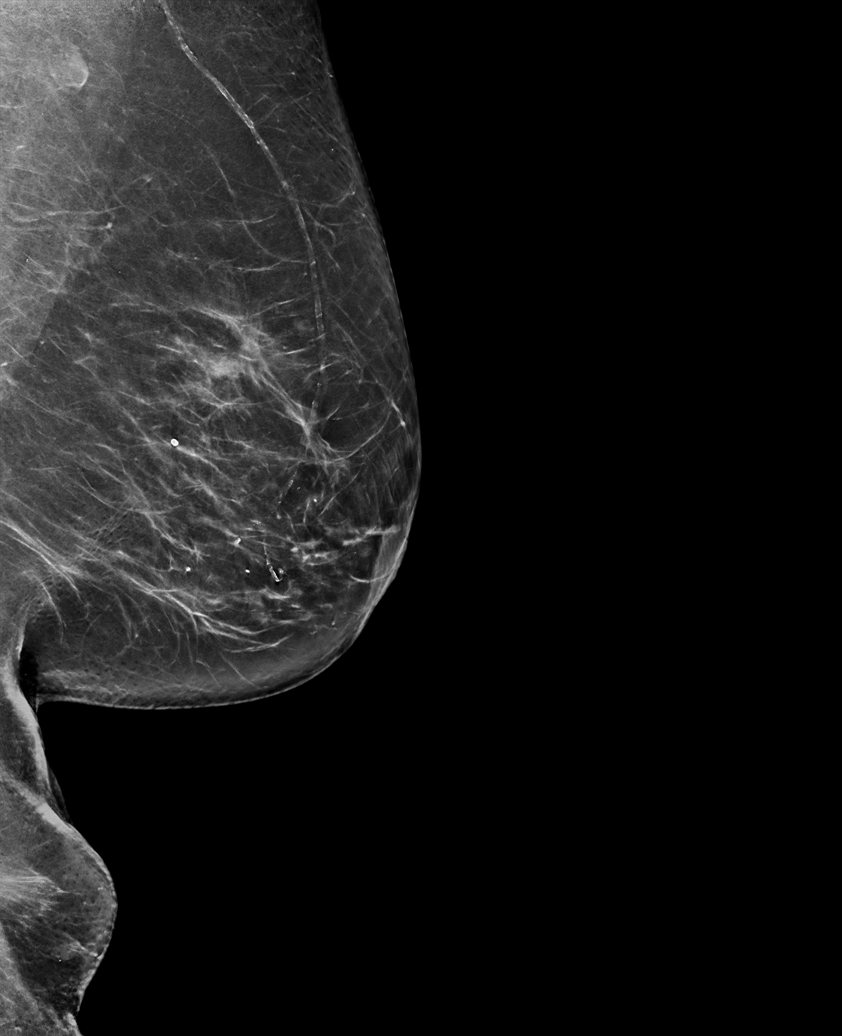

[R MLO synth-2D (1 of 2)]
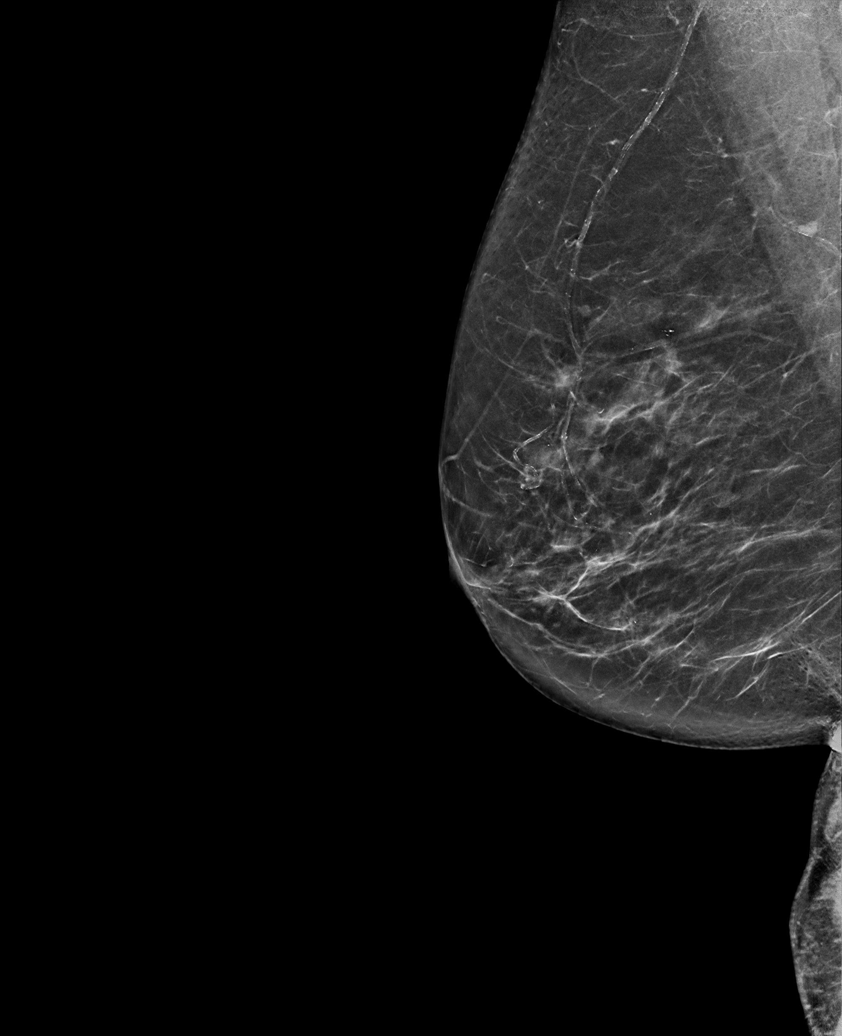

[R MLO synth-2D (2 of 2)]
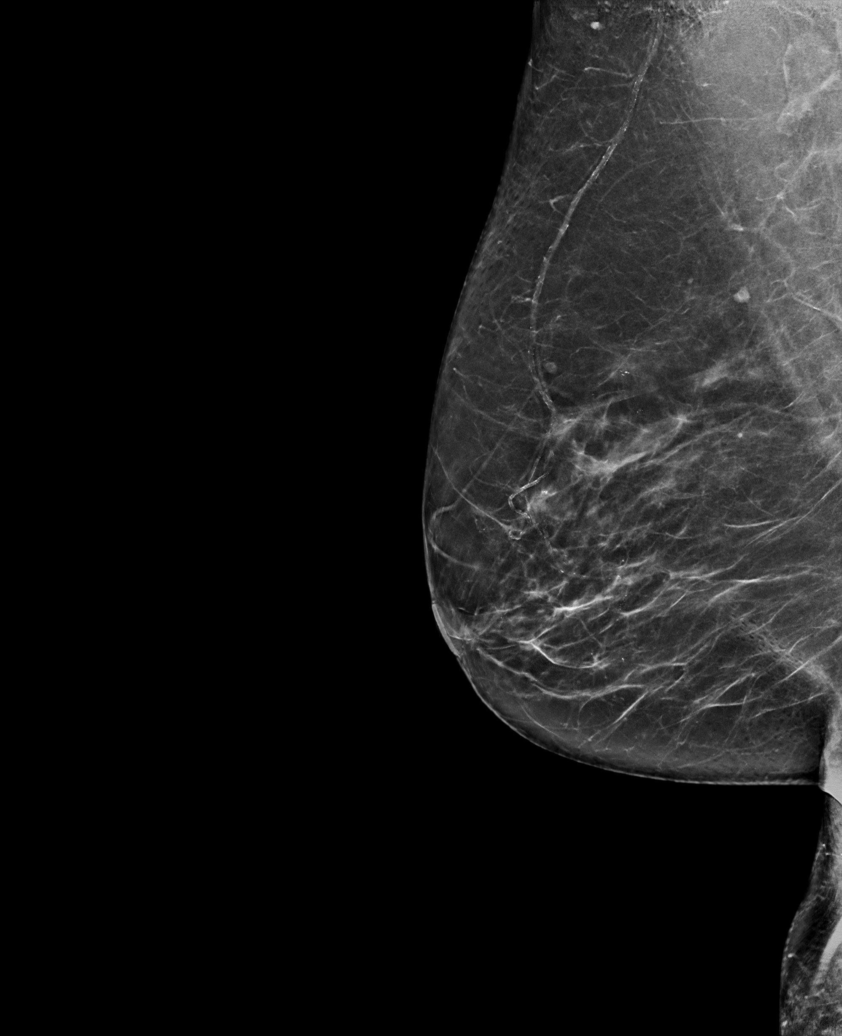

[L CC synth-2D]
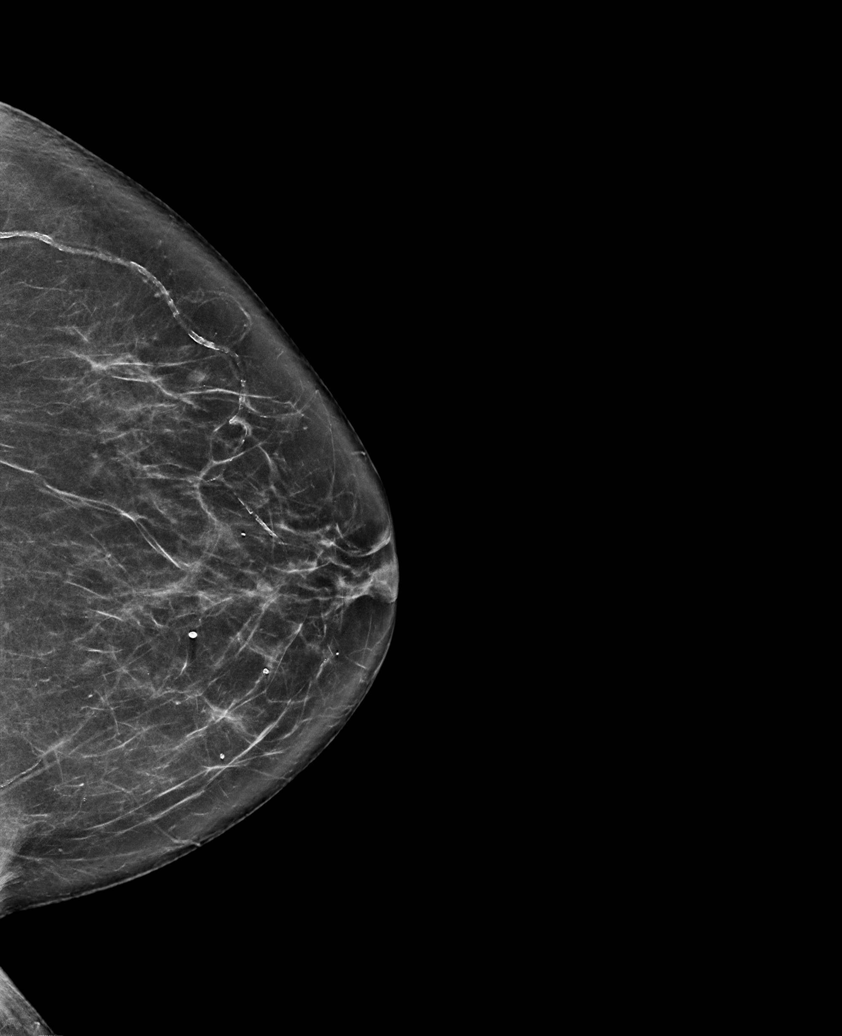

[R CC synth-2D]
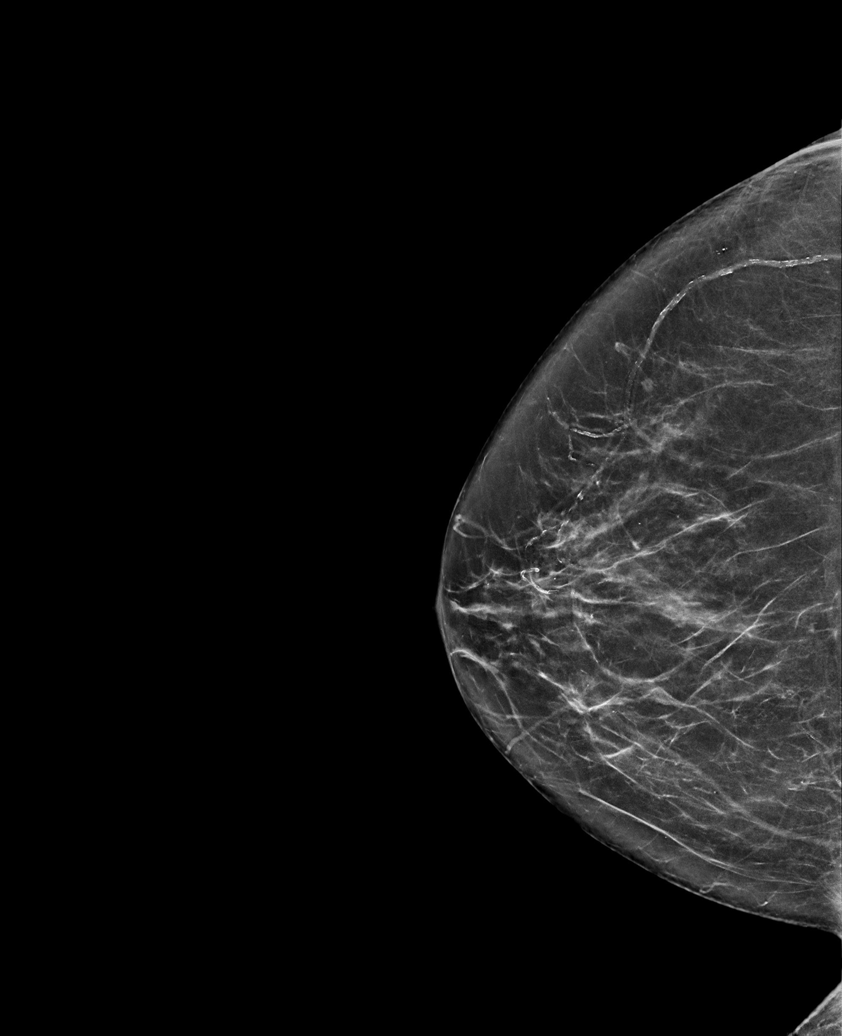

[R CC tomo · tomo slice 35/68.0]
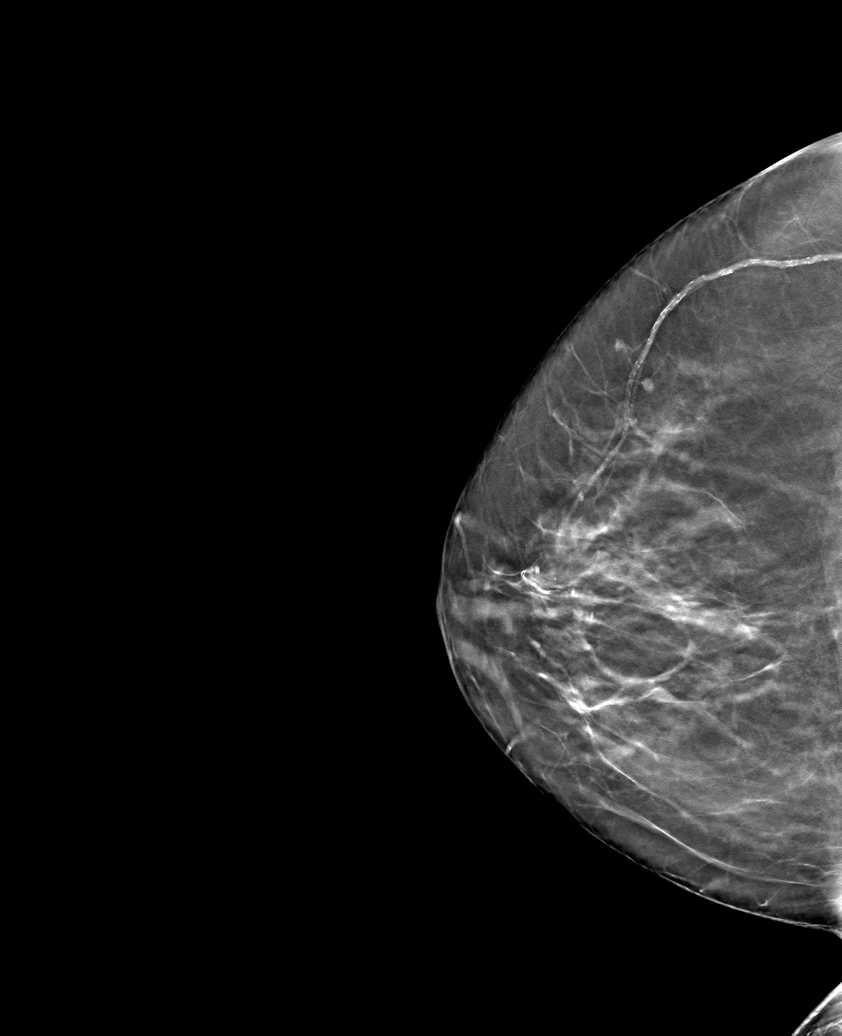

[6 of 30 positions shown; findings below may reference images not displayed]

ACR Breast Density Category b: There are scattered areas of
fibroglandular density.
FINDINGS: There are no findings suspicious for malignancy.
IMPRESSION: No mammographic evidence of malignancy. A result letter of this
screening mammogram will be mailed directly to the patient.

RECOMMENDATION:
Screening mammogram in one year. (Code:51-O-LD2)

BI-RADS CATEGORY  1: Negative.

## 2024-04-08 ENCOUNTER — Other Ambulatory Visit: Payer: Self-pay | Admitting: Internal Medicine

## 2024-04-08 DIAGNOSIS — Z1231 Encounter for screening mammogram for malignant neoplasm of breast: Secondary | ICD-10-CM

## 2024-04-09 LAB — COLOGUARD: COLOGUARD: NEGATIVE

## 2024-04-17 ENCOUNTER — Other Ambulatory Visit: Payer: Self-pay | Admitting: Internal Medicine

## 2024-04-17 DIAGNOSIS — Z780 Asymptomatic menopausal state: Secondary | ICD-10-CM

## 2024-04-17 DIAGNOSIS — M810 Age-related osteoporosis without current pathological fracture: Secondary | ICD-10-CM

## 2024-05-05 ENCOUNTER — Ambulatory Visit
Admission: RE | Admit: 2024-05-05 | Discharge: 2024-05-05 | Disposition: A | Discharge status: Home / Self Care | Source: Ambulatory Visit | Attending: Internal Medicine | Admitting: Internal Medicine

## 2024-05-05 DIAGNOSIS — M810 Age-related osteoporosis without current pathological fracture: Secondary | ICD-10-CM

## 2024-05-05 DIAGNOSIS — Z780 Asymptomatic menopausal state: Secondary | ICD-10-CM | POA: Insufficient documentation

## 2024-05-05 DIAGNOSIS — Z1231 Encounter for screening mammogram for malignant neoplasm of breast: Secondary | ICD-10-CM | POA: Diagnosis present
